# Patient Record
Sex: Male | Born: 1955 | Race: Black or African American | Hispanic: No | Smoking: Never smoker
Health system: Southern US, Community
[De-identification: ages and names within clinical notes are randomized; demographics above are authoritative.]

## PROBLEM LIST (undated history)

## (undated) DIAGNOSIS — R1310 Dysphagia, unspecified: Secondary | ICD-10-CM

## (undated) DIAGNOSIS — T7840XA Allergy, unspecified, initial encounter: Secondary | ICD-10-CM

## (undated) DIAGNOSIS — K5730 Diverticulosis of large intestine without perforation or abscess without bleeding: Secondary | ICD-10-CM

## (undated) DIAGNOSIS — M1990 Unspecified osteoarthritis, unspecified site: Secondary | ICD-10-CM

## (undated) DIAGNOSIS — I639 Cerebral infarction, unspecified: Secondary | ICD-10-CM

## (undated) DIAGNOSIS — E7800 Pure hypercholesterolemia, unspecified: Secondary | ICD-10-CM

## (undated) DIAGNOSIS — R0789 Other chest pain: Secondary | ICD-10-CM

## (undated) HISTORY — DX: Dysphagia, unspecified: R13.10

## (undated) HISTORY — DX: Pure hypercholesterolemia, unspecified: E78.00

## (undated) HISTORY — PX: UPPER GI ENDOSCOPY: SHX6162

## (undated) HISTORY — DX: Other chest pain: R07.89

## (undated) HISTORY — DX: Allergy, unspecified, initial encounter: T78.40XA

## (undated) HISTORY — DX: Diverticulosis of large intestine without perforation or abscess without bleeding: K57.30

## (undated) HISTORY — PX: COLONOSCOPY: SHX174

## (undated) HISTORY — DX: Unspecified osteoarthritis, unspecified site: M19.90

---

## 2004-05-21 ENCOUNTER — Ambulatory Visit: Payer: Self-pay | Admitting: Pulmonary Disease

## 2004-05-24 ENCOUNTER — Ambulatory Visit: Payer: Self-pay | Admitting: Pulmonary Disease

## 2004-05-24 ENCOUNTER — Ambulatory Visit (HOSPITAL_COMMUNITY): Admission: RE | Admit: 2004-05-24 | Discharge: 2004-05-24 | Payer: Self-pay | Admitting: Pulmonary Disease

## 2004-06-28 ENCOUNTER — Ambulatory Visit: Payer: Self-pay | Admitting: Pulmonary Disease

## 2005-04-17 ENCOUNTER — Encounter: Admission: RE | Admit: 2005-04-17 | Discharge: 2005-04-17 | Payer: Self-pay | Admitting: Occupational Medicine

## 2006-05-15 ENCOUNTER — Ambulatory Visit: Payer: Self-pay | Admitting: Pulmonary Disease

## 2006-05-26 ENCOUNTER — Ambulatory Visit: Payer: Self-pay | Admitting: Internal Medicine

## 2006-06-09 ENCOUNTER — Ambulatory Visit: Payer: Self-pay | Admitting: Internal Medicine

## 2008-03-27 ENCOUNTER — Ambulatory Visit: Payer: Self-pay | Admitting: Internal Medicine

## 2008-03-27 ENCOUNTER — Ambulatory Visit (HOSPITAL_COMMUNITY): Admission: RE | Admit: 2008-03-27 | Discharge: 2008-03-27 | Payer: Self-pay | Admitting: Internal Medicine

## 2008-03-27 ENCOUNTER — Ambulatory Visit: Payer: Self-pay | Admitting: Pulmonary Disease

## 2008-03-27 DIAGNOSIS — E7800 Pure hypercholesterolemia, unspecified: Secondary | ICD-10-CM

## 2008-03-27 DIAGNOSIS — R0789 Other chest pain: Secondary | ICD-10-CM

## 2008-03-27 DIAGNOSIS — R1310 Dysphagia, unspecified: Secondary | ICD-10-CM | POA: Insufficient documentation

## 2008-03-27 DIAGNOSIS — E782 Mixed hyperlipidemia: Secondary | ICD-10-CM | POA: Insufficient documentation

## 2008-03-31 ENCOUNTER — Telehealth (INDEPENDENT_AMBULATORY_CARE_PROVIDER_SITE_OTHER): Payer: Self-pay | Admitting: *Deleted

## 2008-04-14 ENCOUNTER — Ambulatory Visit: Payer: Self-pay | Admitting: Internal Medicine

## 2008-04-19 ENCOUNTER — Ambulatory Visit: Payer: Self-pay | Admitting: Internal Medicine

## 2009-03-09 ENCOUNTER — Ambulatory Visit: Payer: Self-pay | Admitting: Pulmonary Disease

## 2009-03-09 DIAGNOSIS — K5730 Diverticulosis of large intestine without perforation or abscess without bleeding: Secondary | ICD-10-CM

## 2009-04-13 ENCOUNTER — Telehealth: Payer: Self-pay | Admitting: Pulmonary Disease

## 2009-08-06 ENCOUNTER — Ambulatory Visit: Payer: Self-pay | Admitting: Internal Medicine

## 2009-08-07 LAB — CONVERTED CEMR LAB
CK-MB: 3.9 ng/mL (ref 0.3–4.0)
Relative Index: 1.3 (ref 0.0–2.5)
Total CK: 300 units/L — ABNORMAL HIGH (ref 7–232)

## 2009-08-08 ENCOUNTER — Telehealth: Payer: Self-pay | Admitting: Internal Medicine

## 2009-08-08 ENCOUNTER — Encounter: Payer: Self-pay | Admitting: Internal Medicine

## 2010-03-13 ENCOUNTER — Telehealth: Payer: Self-pay | Admitting: Pulmonary Disease

## 2010-04-19 ENCOUNTER — Ambulatory Visit: Payer: Self-pay | Admitting: Pulmonary Disease

## 2010-04-19 ENCOUNTER — Encounter: Payer: Self-pay | Admitting: Pulmonary Disease

## 2010-04-21 LAB — CONVERTED CEMR LAB
ALT: 22 units/L (ref 0–53)
AST: 19 units/L (ref 0–37)
Albumin: 4.2 g/dL (ref 3.5–5.2)
Alkaline Phosphatase: 77 units/L (ref 39–117)
BUN: 14 mg/dL (ref 6–23)
Basophils Absolute: 0 10*3/uL (ref 0.0–0.1)
Basophils Relative: 0.7 % (ref 0.0–3.0)
Bilirubin Urine: NEGATIVE
Bilirubin, Direct: 0.1 mg/dL (ref 0.0–0.3)
CO2: 30 meq/L (ref 19–32)
Calcium: 9.6 mg/dL (ref 8.4–10.5)
Chloride: 102 meq/L (ref 96–112)
Cholesterol: 224 mg/dL — ABNORMAL HIGH (ref 0–200)
Creatinine, Ser: 1.1 mg/dL (ref 0.4–1.5)
Direct LDL: 152.7 mg/dL
Eosinophils Absolute: 0.3 10*3/uL (ref 0.0–0.7)
Eosinophils Relative: 7 % — ABNORMAL HIGH (ref 0.0–5.0)
GFR calc non Af Amer: 90.6 mL/min (ref >60)
Glucose, Bld: 86 mg/dL (ref 70–99)
HCT: 44.5 % (ref 39.0–52.0)
HDL: 48.8 mg/dL (ref >39.00)
Hemoglobin, Urine: NEGATIVE
Hemoglobin: 15 g/dL (ref 13.0–17.0)
Ketones, ur: NEGATIVE mg/dL
Leukocytes, UA: NEGATIVE
Lymphocytes Relative: 31.7 % (ref 12.0–46.0)
Lymphs Abs: 1.5 10*3/uL (ref 0.7–4.0)
MCHC: 33.7 g/dL (ref 30.0–36.0)
MCV: 84.8 fL (ref 78.0–100.0)
Monocytes Absolute: 0.5 10*3/uL (ref 0.1–1.0)
Monocytes Relative: 9.7 % (ref 3.0–12.0)
Neutro Abs: 2.4 10*3/uL (ref 1.4–7.7)
Neutrophils Relative %: 50.9 % (ref 43.0–77.0)
Nitrite: NEGATIVE
PSA: 1.57 ng/mL (ref 0.10–4.00)
Platelets: 288 10*3/uL (ref 150.0–400.0)
Potassium: 4.7 meq/L (ref 3.5–5.1)
RBC: 5.25 M/uL (ref 4.22–5.81)
RDW: 14.2 % (ref 11.5–14.6)
Sodium: 138 meq/L (ref 135–145)
Specific Gravity, Urine: 1.025 (ref 1.000–1.030)
TSH: 0.97 microintl units/mL (ref 0.35–5.50)
Total Bilirubin: 0.9 mg/dL (ref 0.3–1.2)
Total CHOL/HDL Ratio: 5
Total Protein, Urine: NEGATIVE mg/dL
Total Protein: 7.6 g/dL (ref 6.0–8.3)
Triglycerides: 60 mg/dL (ref 0.0–149.0)
Urine Glucose: NEGATIVE mg/dL
Urobilinogen, UA: 1 (ref 0.0–1.0)
VLDL: 12 mg/dL (ref 0.0–40.0)
WBC: 4.7 10*3/uL (ref 4.5–10.5)
pH: 6 (ref 5.0–8.0)

## 2010-08-04 LAB — CONVERTED CEMR LAB
ALT: 23 units/L (ref 0–53)
AST: 19 units/L (ref 0–37)
Albumin: 4.3 g/dL (ref 3.5–5.2)
Alkaline Phosphatase: 69 units/L (ref 39–117)
BUN: 11 mg/dL (ref 6–23)
Basophils Absolute: 0 10*3/uL (ref 0.0–0.1)
Basophils Relative: 0.9 % (ref 0.0–3.0)
Bilirubin Urine: NEGATIVE
Bilirubin, Direct: 0.2 mg/dL (ref 0.0–0.3)
CO2: 31 meq/L (ref 19–32)
Calcium: 9.6 mg/dL (ref 8.4–10.5)
Chloride: 101 meq/L (ref 96–112)
Cholesterol: 207 mg/dL — ABNORMAL HIGH (ref 0–200)
Creatinine, Ser: 1 mg/dL (ref 0.4–1.5)
Direct LDL: 142.7 mg/dL
Eosinophils Absolute: 0.3 10*3/uL (ref 0.0–0.7)
Eosinophils Relative: 7.4 % — ABNORMAL HIGH (ref 0.0–5.0)
GFR calc non Af Amer: 100.49 mL/min (ref >60)
Glucose, Bld: 89 mg/dL (ref 70–99)
HCT: 45.6 % (ref 39.0–52.0)
HDL: 43.5 mg/dL (ref >39.00)
Hemoglobin, Urine: NEGATIVE
Hemoglobin: 14.8 g/dL (ref 13.0–17.0)
Ketones, ur: NEGATIVE mg/dL
Leukocytes, UA: NEGATIVE
Lymphocytes Relative: 35.5 % (ref 12.0–46.0)
Lymphs Abs: 1.6 10*3/uL (ref 0.7–4.0)
MCHC: 32.5 g/dL (ref 30.0–36.0)
MCV: 86.3 fL (ref 78.0–100.0)
Monocytes Absolute: 0.3 10*3/uL (ref 0.1–1.0)
Monocytes Relative: 6.7 % (ref 3.0–12.0)
Neutro Abs: 2.2 10*3/uL (ref 1.4–7.7)
Neutrophils Relative %: 49.5 % (ref 43.0–77.0)
Nitrite: NEGATIVE
PSA: 1.14 ng/mL (ref 0.10–4.00)
Platelets: 278 10*3/uL (ref 150.0–400.0)
Potassium: 4.1 meq/L (ref 3.5–5.1)
RBC: 5.29 M/uL (ref 4.22–5.81)
RDW: 13.2 % (ref 11.5–14.6)
Sodium: 138 meq/L (ref 135–145)
Specific Gravity, Urine: 1.02 (ref 1.000–1.030)
TSH: 1.05 microintl units/mL (ref 0.35–5.50)
Total Bilirubin: 1 mg/dL (ref 0.3–1.2)
Total CHOL/HDL Ratio: 5
Total Protein, Urine: NEGATIVE mg/dL
Total Protein: 8.1 g/dL (ref 6.0–8.3)
Triglycerides: 75 mg/dL (ref 0.0–149.0)
Urine Glucose: NEGATIVE mg/dL
Urobilinogen, UA: 0.2 (ref 0.0–1.0)
VLDL: 15 mg/dL (ref 0.0–40.0)
WBC: 4.4 10*3/uL — ABNORMAL LOW (ref 4.5–10.5)
pH: 7 (ref 5.0–8.0)
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
## 2010-08-08 NOTE — Progress Notes (Signed)
Summary: nos appt  Phone Note Call from Patient   Caller: [email or web address]  Call For: nadel Summary of Call: Rsc nos from 9/6 to 10/14 @ 9a. Initial call taken by: Darletta Moll,  March 13, 2010 9:19 AM
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
## 2010-08-08 NOTE — Progress Notes (Signed)
Summary: note  Phone Note Call from Patient Call back at Home Phone [phone]   Caller: Patient Call For: nadel Reason for Call: Talk to Nurse Summary of Call: was in Monday.  Needs a note to go back to work.  Will pick up at lunch Initial call taken by: Eugene Gavia,  August 08, 2009 9:32 AM  Follow-up for Phone Call        [facility] to see exactly what dates the pt needs on the note before I forward to MD. Carron Curie CMA  August 08, 2009 10:01 AM  Pt was seen On 08/06/09. He is requesting a work note for 08/06/09 and 08/07/09 he wants to return to work today. Pelase advise if okay to create latter. Thanks. Carron Curie CMA  August 08, 2009 10:09 AM ok  Follow-up by: Nyoka Cowden MD,  August 08, 2009 11:12 AM  Additional Follow-up for Phone Call Additional follow up Details #1::        note printed. pt awae ready to pick up. Carron Curie CMA  August 08, 2009 11:17 AM

## 2010-08-08 NOTE — Letter (Signed)
Summary: Out of Work  [employer]  [address]   [place], [place] [address]   Phone: [phone]  Fax: [phone]    August 08, 2009   Employee:  FITZHUGH VIZCARRONDO Channell    To Whom It May Concern:   For Medical reasons, please excuse the above named employee from work for the following dates:  Start:   08/06/09  End:   pt may return to work on 08/08/09  If you need additional information, please feel free to contact our office.         Sincerely,    Dr. Sandrea Hughs

## 2011-02-06 ENCOUNTER — Telehealth: Payer: Self-pay | Admitting: Pulmonary Disease

## 2011-02-06 NOTE — Telephone Encounter (Signed)
Last seen by SN 2.2.12 for cpx.  Called spoke with patient who found a tick in his groin this morning in his pubic hair.  Reports tick was not attached but did have some blood in when he "smashed it."  Pt going out of town tomorrow to [place] for "a couple of days for a funeral."  Advised patient to keep an eye out for any redness, bumps, itching, fever, changes in breathing or vision and to call if he notices any of these symptoms.  Advised pt that if the tick was not attached at the groin area, he may not have been bitten there and encouraged him to watch for these signs anywhere.  Pt okay with this and verbalized his understanding.

## 2011-04-07 LAB — CLOTEST (H. PYLORI), BIOPSY: Helicobacter screen: NEGATIVE

## 2011-04-17 ENCOUNTER — Encounter: Payer: Self-pay | Admitting: Pulmonary Disease

## 2011-04-21 ENCOUNTER — Ambulatory Visit (INDEPENDENT_AMBULATORY_CARE_PROVIDER_SITE_OTHER): Payer: BC Managed Care – PPO | Admitting: Pulmonary Disease

## 2011-04-21 ENCOUNTER — Ambulatory Visit (INDEPENDENT_AMBULATORY_CARE_PROVIDER_SITE_OTHER)
Admission: RE | Admit: 2011-04-21 | Discharge: 2011-04-21 | Disposition: A | Discharge status: Home / Self Care | Payer: BC Managed Care – PPO | Source: Ambulatory Visit | Attending: Pulmonary Disease | Admitting: Pulmonary Disease

## 2011-04-21 ENCOUNTER — Encounter: Payer: Self-pay | Admitting: Pulmonary Disease

## 2011-04-21 VITALS — BP 138/86 | HR 77 | Temp 97.9°F | Ht 74.0 in | Wt 221.2 lb

## 2011-04-21 DIAGNOSIS — E7800 Pure hypercholesterolemia, unspecified: Secondary | ICD-10-CM

## 2011-04-21 DIAGNOSIS — Z0000 Encounter for general adult medical examination without abnormal findings: Secondary | ICD-10-CM

## 2011-04-21 DIAGNOSIS — K5730 Diverticulosis of large intestine without perforation or abscess without bleeding: Secondary | ICD-10-CM

## 2011-04-21 DIAGNOSIS — R1310 Dysphagia, unspecified: Secondary | ICD-10-CM

## 2011-04-21 DIAGNOSIS — R0789 Other chest pain: Secondary | ICD-10-CM
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
## 2011-04-21 NOTE — Patient Instructions (Signed)
Today we updated your meds in our EPIC system...  Today we did your follow up CXR & EKG... Please return to our lab one morning this week for your FASTING blood work...    Then please call the [facility] in a few days for your results...    Dial [id number] & when prompted enter your patient number followed by the # symbol...    Your patient number is:   [id number]#  Let's get on track w/ our diet 7 exercise program...    The goal is to lose 15-20 lbs...  Call for any problems...  Let's plan a routine follow up in one year's time.Marland KitchenMarland Kitchen

## 2011-04-22 ENCOUNTER — Other Ambulatory Visit: Payer: Self-pay | Admitting: Pulmonary Disease

## 2011-04-22 ENCOUNTER — Other Ambulatory Visit (INDEPENDENT_AMBULATORY_CARE_PROVIDER_SITE_OTHER): Payer: BC Managed Care – PPO

## 2011-04-22 DIAGNOSIS — Z0000 Encounter for general adult medical examination without abnormal findings: Secondary | ICD-10-CM

## 2011-04-22 LAB — PSA: PSA: 1.58 ng/mL (ref 0.10–4.00)

## 2011-04-22 LAB — URINALYSIS
Bilirubin Urine: NEGATIVE
Hgb urine dipstick: NEGATIVE
Ketones, ur: NEGATIVE
Leukocytes, UA: NEGATIVE
Nitrite: NEGATIVE
Specific Gravity, Urine: >=1.03 (ref 1.000–1.030)
Total Protein, Urine: NEGATIVE
Urine Glucose: NEGATIVE
Urobilinogen, UA: 0.2 (ref 0.0–1.0)
pH: 6 (ref 5.0–8.0)

## 2011-04-22 LAB — HEPATIC FUNCTION PANEL
ALT: 19 U/L (ref 0–53)
AST: 17 U/L (ref 0–37)
Albumin: 4.1 g/dL (ref 3.5–5.2)
Alkaline Phosphatase: 66 U/L (ref 39–117)
Bilirubin, Direct: 0.1 mg/dL (ref 0.0–0.3)
Total Protein: 7.6 g/dL (ref 6.0–8.3)

## 2011-04-22 LAB — CBC WITH DIFFERENTIAL/PLATELET
Basophils Absolute: 0 10*3/uL (ref 0.0–0.1)
Eosinophils Absolute: 0.2 10*3/uL (ref 0.0–0.7)
HCT: 43.3 % (ref 39.0–52.0)
Hemoglobin: 14.1 g/dL (ref 13.0–17.0)
Lymphocytes Relative: 36.3 % (ref 12.0–46.0)
MCHC: 32.7 g/dL (ref 30.0–36.0)
MCV: 85.3 fl (ref 78.0–100.0)
Monocytes Relative: 6.4 % (ref 3.0–12.0)
Neutro Abs: 2.5 10*3/uL (ref 1.4–7.7)
Platelets: 271 10*3/uL (ref 150.0–400.0)
RBC: 5.07 Mil/uL (ref 4.22–5.81)
RDW: 14.1 % (ref 11.5–14.6)
WBC: 4.8 10*3/uL (ref 4.5–10.5)

## 2011-04-22 LAB — BASIC METABOLIC PANEL
CO2: 29 mEq/L (ref 19–32)
Calcium: 9.4 mg/dL (ref 8.4–10.5)
Chloride: 104 mEq/L (ref 96–112)
GFR: 100.86 mL/min (ref >60.00)
Glucose, Bld: 88 mg/dL (ref 70–99)
Potassium: 4.3 mEq/L (ref 3.5–5.1)
Sodium: 139 mEq/L (ref 135–145)

## 2011-04-22 LAB — LIPID PANEL
HDL: 46.9 mg/dL (ref >39.00)
Total CHOL/HDL Ratio: 5

## 2011-04-22 LAB — TSH: TSH: 1.59 u[IU]/mL (ref 0.35–5.50)

## 2011-05-05 ENCOUNTER — Encounter: Payer: Self-pay | Admitting: Pulmonary Disease

## 2011-05-08 ENCOUNTER — Telehealth: Payer: Self-pay | Admitting: Pulmonary Disease

## 2011-05-08 NOTE — Telephone Encounter (Signed)
SN, pt decided to try statin- please advise what to call in for him, thanks! No Known Allergies

## 2011-05-09 MED ORDER — ROSUVASTATIN CALCIUM 20 MG PO TABS: 20.0000 mg | ORAL_TABLET | Freq: Every day | ORAL | Status: DC

## 2011-05-09 NOTE — Telephone Encounter (Signed)
Called and spoke with pt and he stated that he is willing to try something for his chol.  Per SN--ok to start on crestor 20mg    Once daily.  Pt is aware that this has been sent to the pharmacy per his request for a #90 day supply and pt is aware to call in about 8-12 weeks to have his labs rechecked on the crestor.  Pt voiced his understanding of this.

## 2011-05-22 ENCOUNTER — Ambulatory Visit (INDEPENDENT_AMBULATORY_CARE_PROVIDER_SITE_OTHER): Payer: BC Managed Care – PPO | Admitting: Adult Health

## 2011-05-22 ENCOUNTER — Encounter: Payer: Self-pay | Admitting: Adult Health

## 2011-05-22 ENCOUNTER — Encounter: Payer: Self-pay | Admitting: Allergy

## 2011-05-22 DIAGNOSIS — J069 Acute upper respiratory infection, unspecified: Secondary | ICD-10-CM

## 2011-05-22 NOTE — Patient Instructions (Signed)
Mucinex DM Twice daily  As needed  Cough/congestion  Fluids and rest  Tylenol As needed   Saline nasal rinses As needed   Please contact office for sooner follow up if symptoms do not improve or worsen or seek emergency care

## 2011-05-22 NOTE — Assessment & Plan Note (Signed)
Flare -suspect this is viral in nature and self liimiting  Plan:  Mucinex DM Twice daily  As needed  Cough/congestion  Fluids and rest  Tylenol As needed   Saline nasal rinses As needed   Please contact office for sooner follow up if symptoms do not improve or worsen or seek emergency care
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
## 2012-04-19 ENCOUNTER — Encounter: Payer: Self-pay | Admitting: *Deleted

## 2012-04-20 ENCOUNTER — Ambulatory Visit (INDEPENDENT_AMBULATORY_CARE_PROVIDER_SITE_OTHER)
Admission: RE | Admit: 2012-04-20 | Discharge: 2012-04-20 | Disposition: A | Discharge status: Home / Self Care | Payer: BC Managed Care – PPO | Source: Ambulatory Visit | Attending: Pulmonary Disease | Admitting: Pulmonary Disease

## 2012-04-20 ENCOUNTER — Other Ambulatory Visit (INDEPENDENT_AMBULATORY_CARE_PROVIDER_SITE_OTHER): Payer: BC Managed Care – PPO

## 2012-04-20 ENCOUNTER — Ambulatory Visit (INDEPENDENT_AMBULATORY_CARE_PROVIDER_SITE_OTHER): Payer: BC Managed Care – PPO | Admitting: Pulmonary Disease

## 2012-04-20 ENCOUNTER — Encounter: Payer: Self-pay | Admitting: Pulmonary Disease

## 2012-04-20 VITALS — BP 122/80 | HR 69 | Temp 98.2°F | Ht 74.0 in | Wt 217.2 lb

## 2012-04-20 DIAGNOSIS — Z0000 Encounter for general adult medical examination without abnormal findings: Secondary | ICD-10-CM

## 2012-04-20 DIAGNOSIS — E7800 Pure hypercholesterolemia, unspecified: Secondary | ICD-10-CM

## 2012-04-20 DIAGNOSIS — K5730 Diverticulosis of large intestine without perforation or abscess without bleeding: Secondary | ICD-10-CM

## 2012-04-20 LAB — CBC WITH DIFFERENTIAL/PLATELET
Basophils Relative: 0.5 % (ref 0.0–3.0)
Eosinophils Relative: 5.2 % — ABNORMAL HIGH (ref 0.0–5.0)
HCT: 43.7 % (ref 39.0–52.0)
Lymphs Abs: 1.7 10*3/uL (ref 0.7–4.0)
MCV: 84.2 fl (ref 78.0–100.0)
Monocytes Absolute: 0.4 10*3/uL (ref 0.1–1.0)
Neutro Abs: 2.6 10*3/uL (ref 1.4–7.7)
RBC: 5.19 Mil/uL (ref 4.22–5.81)
WBC: 5 10*3/uL (ref 4.5–10.5)

## 2012-04-20 LAB — LIPID PANEL
HDL: 46.8 mg/dL (ref >39.00)
VLDL: 17 mg/dL (ref 0.0–40.0)

## 2012-04-20 LAB — BASIC METABOLIC PANEL
BUN: 10 mg/dL (ref 6–23)
Creatinine, Ser: 1.1 mg/dL (ref 0.4–1.5)
GFR: 89.93 mL/min (ref >60.00)
Potassium: 4.5 mEq/L (ref 3.5–5.1)

## 2012-04-20 LAB — HEPATIC FUNCTION PANEL
AST: 18 U/L (ref 0–37)
Total Bilirubin: 0.6 mg/dL (ref 0.3–1.2)

## 2012-04-20 NOTE — Patient Instructions (Addendum)
Today we updated your med list in our EPIC system...    Continue your current medications the same...  Today we did your follow up CXR & FASTING blood work...    We will call you w/ the results and see if you need to start on a cholestrol med...  Keep up the good work w/ diet & exercise... Work on weight reduction...  Call for any problems...  Let's continue our yearly follow up physicals, but if we start cholesterol meds we will want to bring you back to our lab to monitor your progress.Marland KitchenMarland Kitchen
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
## 2012-04-26 ENCOUNTER — Other Ambulatory Visit: Payer: Self-pay | Admitting: Pulmonary Disease

## 2012-04-26 DIAGNOSIS — R972 Elevated prostate specific antigen [PSA]: Secondary | ICD-10-CM

## 2012-04-26 DIAGNOSIS — E7800 Pure hypercholesterolemia, unspecified: Secondary | ICD-10-CM

## 2012-04-26 MED ORDER — ROSUVASTATIN CALCIUM 20 MG PO TABS: 20.0000 mg | ORAL_TABLET | Freq: Every day | ORAL | Status: DC

## 2012-10-07 IMAGING — CR DG CHEST 2V
2 series · 2 of 2 positions shown · non-contrast
Comparison: 04/19/2010

CLINICAL DATA: Physical.

CHEST - 2 VIEW

[view not recorded (1 of 2)]
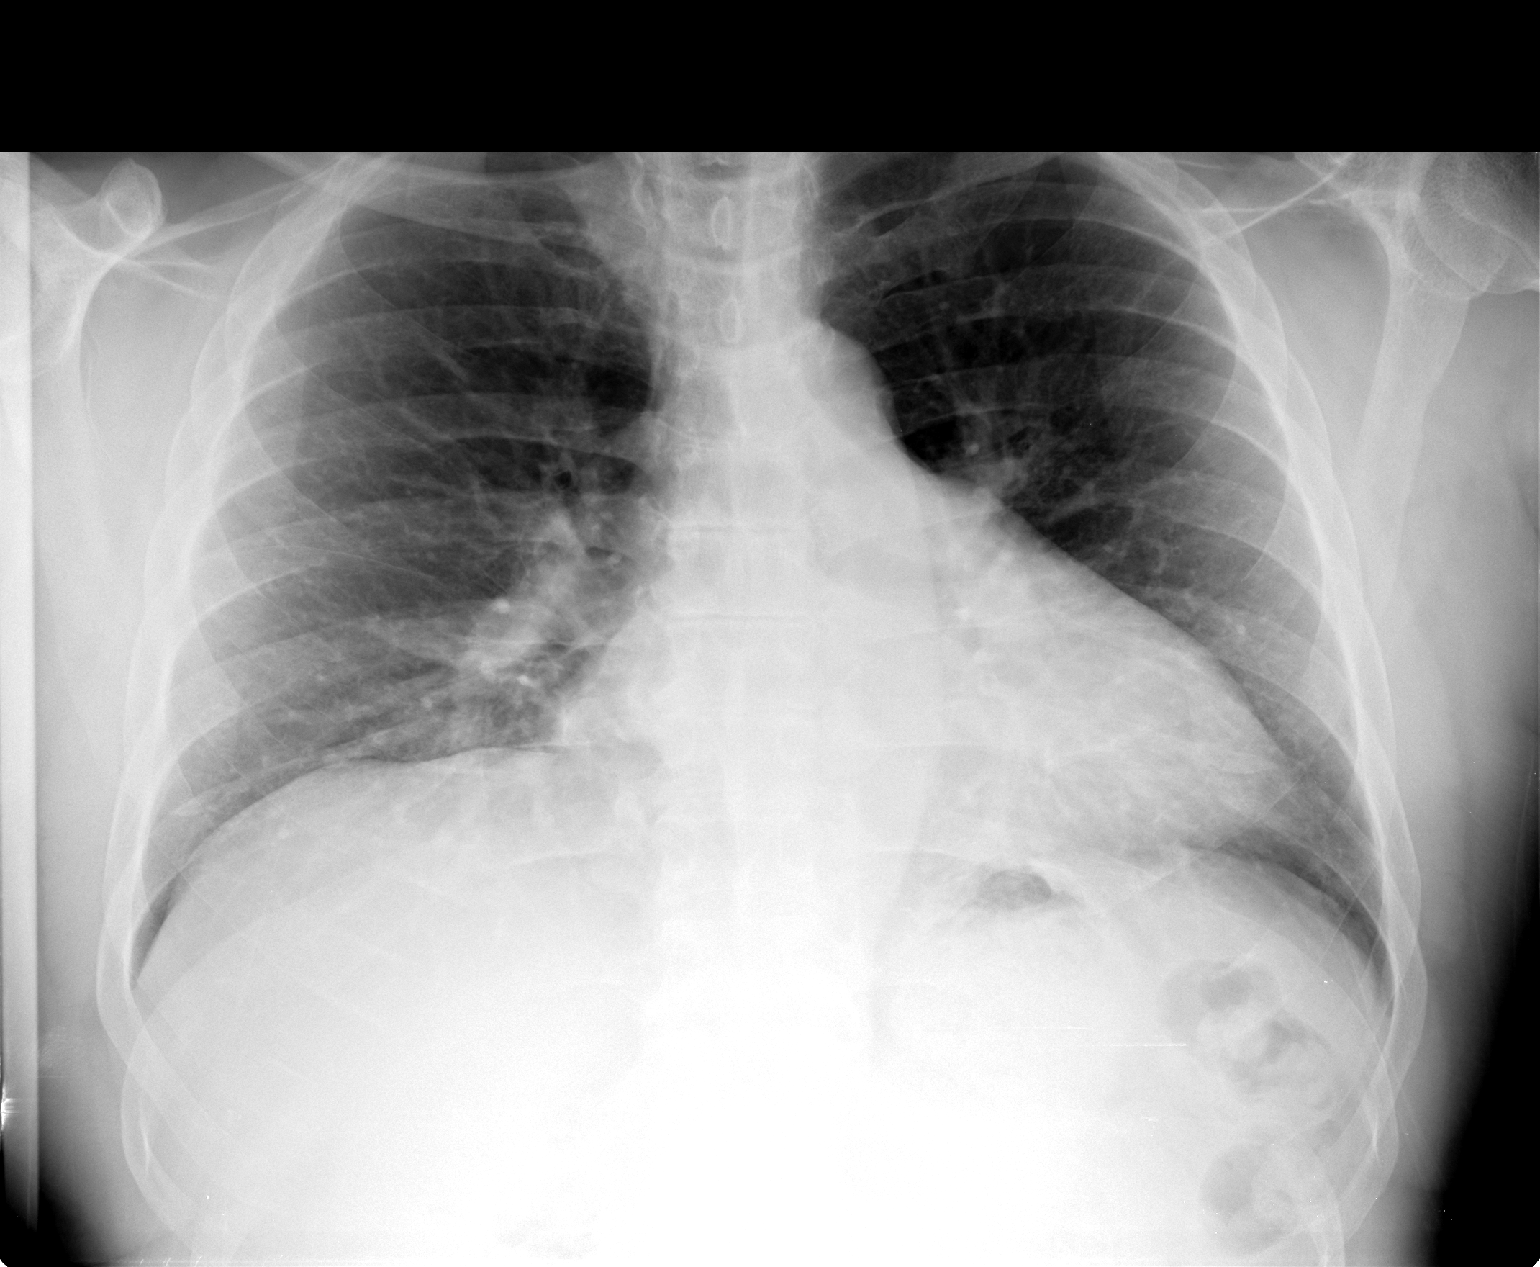

[view not recorded (2 of 2)]
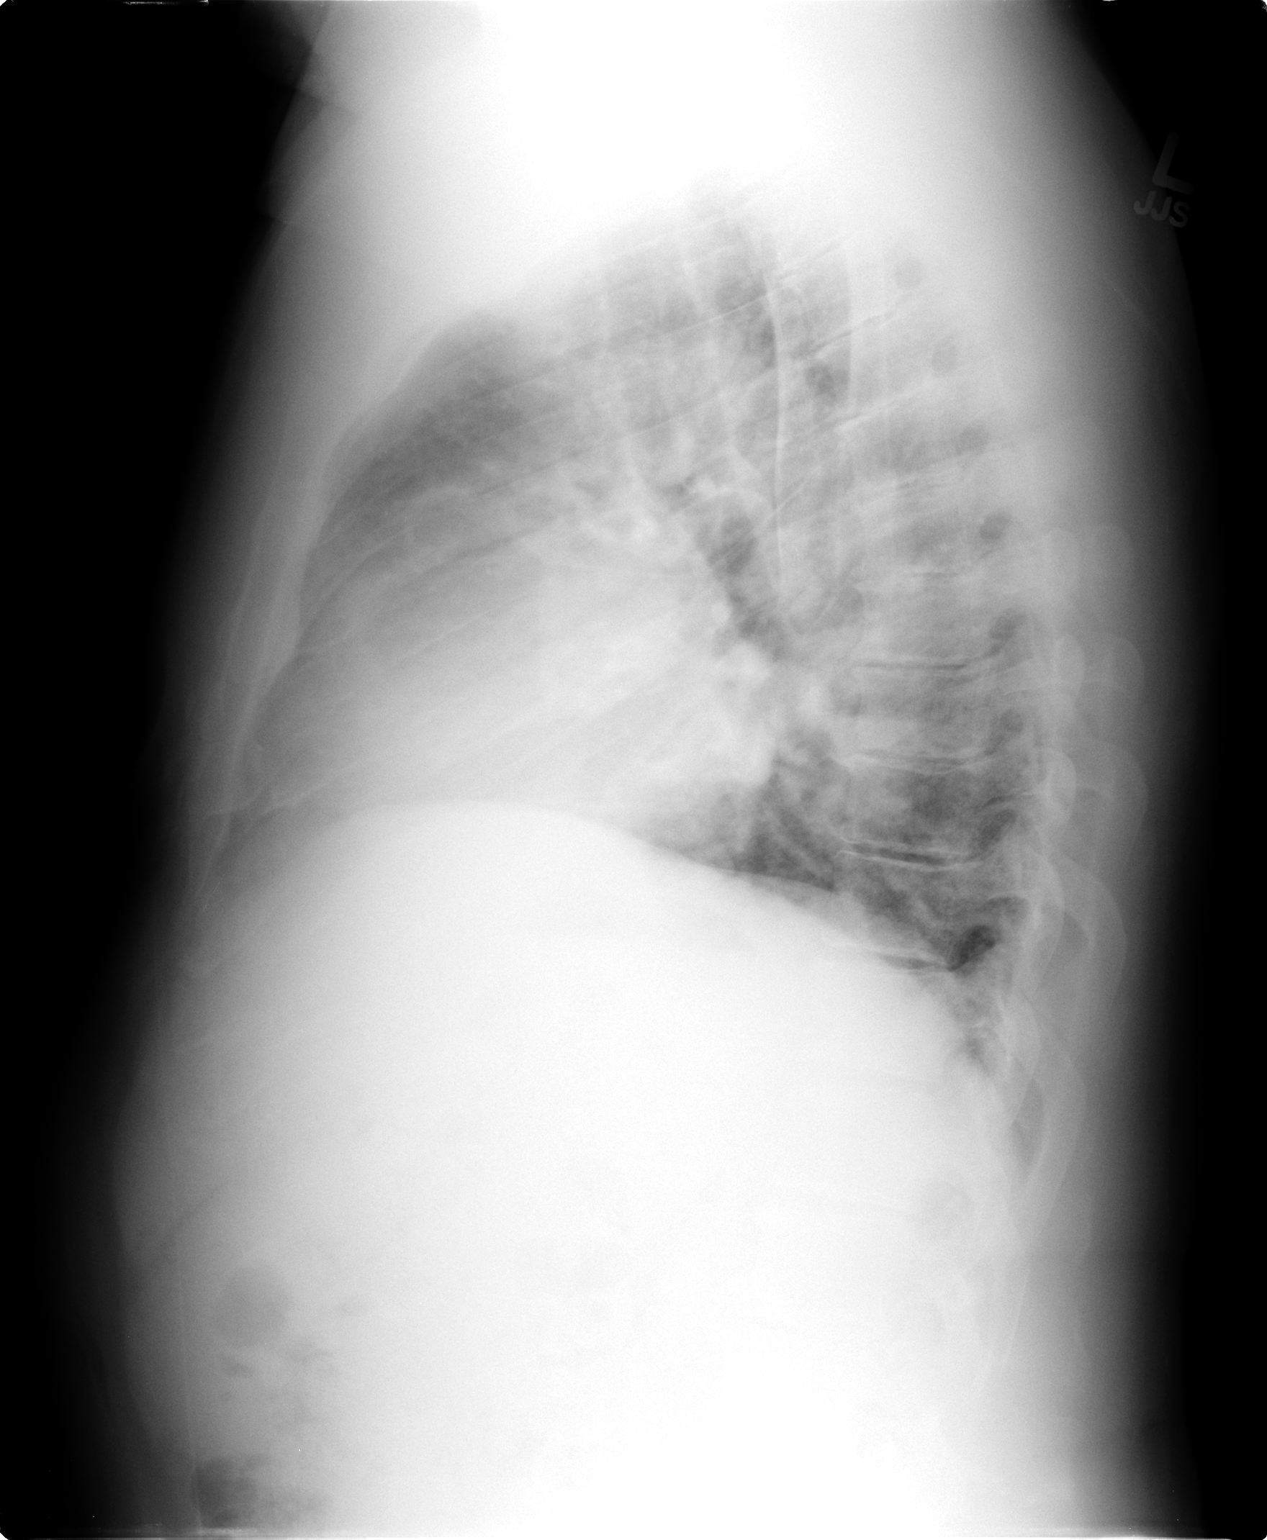

[2 of 2 positions shown; findings below may reference images not displayed]

FINDINGS: Mild cardiomegaly.  There are low lung volumes with
bibasilar atelectasis.  No effusions or overt edema.  No acute bony
abnormality.
IMPRESSION: Cardiomegaly.  Bibasilar atelectasis.

## 2013-04-20 ENCOUNTER — Ambulatory Visit (INDEPENDENT_AMBULATORY_CARE_PROVIDER_SITE_OTHER): Payer: BC Managed Care – PPO | Admitting: Pulmonary Disease

## 2013-04-20 ENCOUNTER — Encounter: Payer: Self-pay | Admitting: Pulmonary Disease

## 2013-04-20 ENCOUNTER — Other Ambulatory Visit (INDEPENDENT_AMBULATORY_CARE_PROVIDER_SITE_OTHER): Payer: BC Managed Care – PPO

## 2013-04-20 VITALS — BP 120/84 | HR 69 | Temp 97.8°F | Ht 74.0 in | Wt 212.4 lb

## 2013-04-20 DIAGNOSIS — Z0000 Encounter for general adult medical examination without abnormal findings: Secondary | ICD-10-CM | POA: Insufficient documentation

## 2013-04-20 DIAGNOSIS — K5730 Diverticulosis of large intestine without perforation or abscess without bleeding: Secondary | ICD-10-CM

## 2013-04-20 DIAGNOSIS — E7800 Pure hypercholesterolemia, unspecified: Secondary | ICD-10-CM

## 2013-04-20 LAB — BASIC METABOLIC PANEL
Calcium: 9.8 mg/dL (ref 8.4–10.5)
GFR: 90.57 mL/min (ref >60.00)
Sodium: 137 mEq/L (ref 135–145)

## 2013-04-20 LAB — CBC WITH DIFFERENTIAL/PLATELET
Basophils Absolute: 0 10*3/uL (ref 0.0–0.1)
Basophils Relative: 0.5 % (ref 0.0–3.0)
HCT: 41.4 % (ref 39.0–52.0)
Hemoglobin: 13.8 g/dL (ref 13.0–17.0)
Lymphs Abs: 1.8 10*3/uL (ref 0.7–4.0)
MCHC: 33.4 g/dL (ref 30.0–36.0)
Monocytes Relative: 7.4 % (ref 3.0–12.0)
Neutro Abs: 2.5 10*3/uL (ref 1.4–7.7)
RBC: 5.05 Mil/uL (ref 4.22–5.81)
RDW: 14 % (ref 11.5–14.6)

## 2013-04-20 LAB — LIPID PANEL
HDL: 47.9 mg/dL (ref >39.00)
Total CHOL/HDL Ratio: 5
Triglycerides: 73 mg/dL (ref 0.0–149.0)

## 2013-04-20 LAB — HEPATIC FUNCTION PANEL
Albumin: 4.2 g/dL (ref 3.5–5.2)
Alkaline Phosphatase: 74 U/L (ref 39–117)
Bilirubin, Direct: 0.1 mg/dL (ref 0.0–0.3)

## 2013-04-20 MED ORDER — ROSUVASTATIN CALCIUM 20 MG PO TABS: 20.0000 mg | ORAL_TABLET | Freq: Every day | ORAL | Status: DC
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
## 2013-04-20 NOTE — Patient Instructions (Signed)
Today we updated your med list in our EPIC system...    Continue your current medications the same...  Today we wrote a new prescription for CRESTOR 20mg  to take one tab daily for your Choloesterol...    Be sure to follow a low cholesterol/ low fat diet as well...  We should plan a follow up LIPID PROFILE in about 3 months- call us in January when you are ready to recheck this lab...  Today we did your follow up CXR, EKG, & FASTING blood work...    We will contact you w/ the results when available...   Call for any questions...  Let's plan a follow up visit in 65yr, sooner if needed for problems.Marland KitchenMarland Kitchen

## 2013-04-25 ENCOUNTER — Other Ambulatory Visit: Payer: Self-pay | Admitting: Pulmonary Disease

## 2013-04-25 DIAGNOSIS — E7800 Pure hypercholesterolemia, unspecified: Secondary | ICD-10-CM

## 2013-05-17 ENCOUNTER — Other Ambulatory Visit: Payer: Self-pay | Admitting: Pulmonary Disease

## 2013-05-17 MED ORDER — ROSUVASTATIN CALCIUM 20 MG PO TABS: 20.0000 mg | ORAL_TABLET | Freq: Every day | ORAL | Status: DC

## 2014-03-21 ENCOUNTER — Ambulatory Visit: Payer: BC Managed Care – PPO | Admitting: Pulmonary Disease

## 2014-04-20 ENCOUNTER — Encounter: Payer: Self-pay | Admitting: Pulmonary Disease

## 2014-04-20 ENCOUNTER — Encounter (INDEPENDENT_AMBULATORY_CARE_PROVIDER_SITE_OTHER): Payer: Self-pay

## 2014-04-20 ENCOUNTER — Ambulatory Visit (INDEPENDENT_AMBULATORY_CARE_PROVIDER_SITE_OTHER): Payer: BC Managed Care – PPO | Admitting: Pulmonary Disease

## 2014-04-20 VITALS — BP 120/84 | HR 65 | Temp 98.3°F | Ht 74.0 in | Wt 220.0 lb

## 2014-04-20 DIAGNOSIS — E7800 Pure hypercholesterolemia, unspecified: Secondary | ICD-10-CM

## 2014-04-20 DIAGNOSIS — R1310 Dysphagia, unspecified: Secondary | ICD-10-CM

## 2014-04-20 DIAGNOSIS — Z0000 Encounter for general adult medical examination without abnormal findings: Secondary | ICD-10-CM

## 2014-04-20 DIAGNOSIS — K5730 Diverticulosis of large intestine without perforation or abscess without bleeding: Secondary | ICD-10-CM

## 2014-04-20 MED ORDER — ROSUVASTATIN CALCIUM 20 MG PO TABS: 20.0000 mg | ORAL_TABLET | Freq: Every day | ORAL | Status: DC

## 2014-04-20 NOTE — Patient Instructions (Signed)
Today we updated your med list in our EPIC system...    Continue your current medications the same...  Please return to our lab in the AM for your FASTING blood work and f/u CXR...    We will contact you w/ the results when available...   We refilled your Crestor20... Be sure to take it daily...     Then let's plan a follow up Lipid Panel in about 3 months...  Keep up the good work w/ diet & exercise...  Call for any questions...  Let's plan a follow up visit in 89yr, sooner if needed for problems.Marland Kitchen..Marland Kitchen
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
## 2014-04-21 ENCOUNTER — Other Ambulatory Visit (INDEPENDENT_AMBULATORY_CARE_PROVIDER_SITE_OTHER): Payer: BC Managed Care – PPO

## 2014-04-21 ENCOUNTER — Ambulatory Visit (INDEPENDENT_AMBULATORY_CARE_PROVIDER_SITE_OTHER)
Admission: RE | Admit: 2014-04-21 | Discharge: 2014-04-21 | Disposition: A | Discharge status: Home / Self Care | Payer: BC Managed Care – PPO | Source: Ambulatory Visit | Attending: Pulmonary Disease | Admitting: Pulmonary Disease

## 2014-04-21 DIAGNOSIS — Z0000 Encounter for general adult medical examination without abnormal findings: Secondary | ICD-10-CM

## 2014-04-21 DIAGNOSIS — E7800 Pure hypercholesterolemia, unspecified: Secondary | ICD-10-CM

## 2014-04-21 LAB — CBC WITH DIFFERENTIAL/PLATELET
BASOS PCT: 0.5 % (ref 0.0–3.0)
Basophils Absolute: 0 10*3/uL (ref 0.0–0.1)
EOS ABS: 0.2 10*3/uL (ref 0.0–0.7)
Eosinophils Relative: 4.8 % (ref 0.0–5.0)
HCT: 42.1 % (ref 39.0–52.0)
Hemoglobin: 13.7 g/dL (ref 13.0–17.0)
Lymphocytes Relative: 39.8 % (ref 12.0–46.0)
Lymphs Abs: 2 10*3/uL (ref 0.7–4.0)
MCHC: 32.5 g/dL (ref 30.0–36.0)
MCV: 83.4 fl (ref 78.0–100.0)
MONO ABS: 0.3 10*3/uL (ref 0.1–1.0)
Monocytes Relative: 6.6 % (ref 3.0–12.0)
NEUTROS PCT: 48.3 % (ref 43.0–77.0)
Neutro Abs: 2.4 10*3/uL (ref 1.4–7.7)
PLATELETS: 328 10*3/uL (ref 150.0–400.0)
RBC: 5.05 Mil/uL (ref 4.22–5.81)
RDW: 14.3 % (ref 11.5–15.5)
WBC: 5 10*3/uL (ref 4.0–10.5)

## 2014-04-21 LAB — HEPATIC FUNCTION PANEL
ALK PHOS: 69 U/L (ref 39–117)
ALT: 26 U/L (ref 0–53)
AST: 21 U/L (ref 0–37)
Albumin: 3.5 g/dL (ref 3.5–5.2)
BILIRUBIN DIRECT: 0.1 mg/dL (ref 0.0–0.3)
Total Bilirubin: 0.6 mg/dL (ref 0.2–1.2)
Total Protein: 7.5 g/dL (ref 6.0–8.3)

## 2014-04-21 LAB — TSH: TSH: 1.64 u[IU]/mL (ref 0.35–4.50)

## 2014-04-21 LAB — BASIC METABOLIC PANEL
BUN: 14 mg/dL (ref 6–23)
CHLORIDE: 103 meq/L (ref 96–112)
CO2: 27 meq/L (ref 19–32)
CREATININE: 1 mg/dL (ref 0.4–1.5)
Calcium: 9.7 mg/dL (ref 8.4–10.5)
GFR: 94.27 mL/min (ref >60.00)
GLUCOSE: 93 mg/dL (ref 70–99)
Potassium: 4.4 mEq/L (ref 3.5–5.1)
Sodium: 139 mEq/L (ref 135–145)

## 2014-04-21 LAB — LIPID PANEL
CHOL/HDL RATIO: 5
Cholesterol: 211 mg/dL — ABNORMAL HIGH (ref 0–200)
HDL: 40.7 mg/dL (ref >39.00)
LDL Cholesterol: 157 mg/dL — ABNORMAL HIGH (ref 0–99)
NONHDL: 170.3
TRIGLYCERIDES: 65 mg/dL (ref 0.0–149.0)
VLDL: 13 mg/dL (ref 0.0–40.0)

## 2014-04-21 LAB — PSA: PSA: 2.52 ng/mL (ref 0.10–4.00)

## 2015-04-23 ENCOUNTER — Encounter: Payer: Self-pay | Admitting: Pulmonary Disease

## 2015-04-23 ENCOUNTER — Other Ambulatory Visit (INDEPENDENT_AMBULATORY_CARE_PROVIDER_SITE_OTHER): Payer: BLUE CROSS/BLUE SHIELD

## 2015-04-23 ENCOUNTER — Ambulatory Visit (INDEPENDENT_AMBULATORY_CARE_PROVIDER_SITE_OTHER)
Admission: RE | Admit: 2015-04-23 | Discharge: 2015-04-23 | Disposition: A | Discharge status: Home / Self Care | Payer: BLUE CROSS/BLUE SHIELD | Source: Ambulatory Visit | Attending: Pulmonary Disease | Admitting: Pulmonary Disease

## 2015-04-23 ENCOUNTER — Ambulatory Visit (INDEPENDENT_AMBULATORY_CARE_PROVIDER_SITE_OTHER): Payer: BLUE CROSS/BLUE SHIELD | Admitting: Pulmonary Disease

## 2015-04-23 VITALS — BP 106/70 | HR 90 | Temp 97.9°F | Wt 210.0 lb

## 2015-04-23 DIAGNOSIS — E7800 Pure hypercholesterolemia, unspecified: Secondary | ICD-10-CM

## 2015-04-23 DIAGNOSIS — F419 Anxiety disorder, unspecified: Secondary | ICD-10-CM | POA: Diagnosis not present

## 2015-04-23 DIAGNOSIS — Z0000 Encounter for general adult medical examination without abnormal findings: Secondary | ICD-10-CM

## 2015-04-23 DIAGNOSIS — N400 Enlarged prostate without lower urinary tract symptoms: Secondary | ICD-10-CM

## 2015-04-23 DIAGNOSIS — R1310 Dysphagia, unspecified: Secondary | ICD-10-CM | POA: Diagnosis not present

## 2015-04-23 DIAGNOSIS — K5730 Diverticulosis of large intestine without perforation or abscess without bleeding: Secondary | ICD-10-CM

## 2015-04-23 LAB — HEPATIC FUNCTION PANEL
ALT: 20 U/L (ref 0–53)
AST: 15 U/L (ref 0–37)
Albumin: 4.4 g/dL (ref 3.5–5.2)
Alkaline Phosphatase: 77 U/L (ref 39–117)
BILIRUBIN DIRECT: 0.1 mg/dL (ref 0.0–0.3)
BILIRUBIN TOTAL: 0.6 mg/dL (ref 0.2–1.2)
Total Protein: 7.7 g/dL (ref 6.0–8.3)

## 2015-04-23 LAB — LIPID PANEL
CHOL/HDL RATIO: 4
Cholesterol: 222 mg/dL — ABNORMAL HIGH (ref 0–200)
HDL: 51.4 mg/dL (ref >39.00)
LDL CALC: 156 mg/dL — AB (ref 0–99)
NONHDL: 170.43
TRIGLYCERIDES: 72 mg/dL (ref 0.0–149.0)
VLDL: 14.4 mg/dL (ref 0.0–40.0)

## 2015-04-23 LAB — CBC WITH DIFFERENTIAL/PLATELET
BASOS PCT: 0.6 % (ref 0.0–3.0)
Basophils Absolute: 0 10*3/uL (ref 0.0–0.1)
Eosinophils Absolute: 0.2 10*3/uL (ref 0.0–0.7)
Eosinophils Relative: 3.6 % (ref 0.0–5.0)
HEMATOCRIT: 45.1 % (ref 39.0–52.0)
Hemoglobin: 14.6 g/dL (ref 13.0–17.0)
LYMPHS PCT: 40.1 % (ref 12.0–46.0)
Lymphs Abs: 2.3 10*3/uL (ref 0.7–4.0)
MCHC: 32.3 g/dL (ref 30.0–36.0)
MCV: 83.3 fl (ref 78.0–100.0)
MONOS PCT: 6 % (ref 3.0–12.0)
Monocytes Absolute: 0.3 10*3/uL (ref 0.1–1.0)
Neutro Abs: 2.8 10*3/uL (ref 1.4–7.7)
Neutrophils Relative %: 49.7 % (ref 43.0–77.0)
Platelets: 301 10*3/uL (ref 150.0–400.0)
RBC: 5.42 Mil/uL (ref 4.22–5.81)
RDW: 14.1 % (ref 11.5–15.5)
WBC: 5.6 10*3/uL (ref 4.0–10.5)

## 2015-04-23 LAB — BASIC METABOLIC PANEL
BUN: 12 mg/dL (ref 6–23)
CALCIUM: 10 mg/dL (ref 8.4–10.5)
CO2: 31 mEq/L (ref 19–32)
CREATININE: 1.18 mg/dL (ref 0.40–1.50)
Chloride: 103 mEq/L (ref 96–112)
GFR: 81.2 mL/min (ref >60.00)
Glucose, Bld: 99 mg/dL (ref 70–99)
Potassium: 4.4 mEq/L (ref 3.5–5.1)
Sodium: 141 mEq/L (ref 135–145)

## 2015-04-23 LAB — TSH: TSH: 0.95 u[IU]/mL (ref 0.35–4.50)

## 2015-04-23 LAB — PSA: PSA: 3.39 ng/mL (ref 0.10–4.00)
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
## 2015-04-23 NOTE — Patient Instructions (Signed)
Today we updated your med list in our EPIC system...    Continue your current medications the same...  Today we checked your follow up CXR, EKG, & FASTING blood work...    We will contact you w/ the results when available...   Keep up the good work w/ your diet & exercise program...  Call for any questions...  Let's plan a follow up visit in [id number], sooner if needed for problems.Marland Kitchen..Marland Kitchen

## 2015-08-07 ENCOUNTER — Ambulatory Visit (INDEPENDENT_AMBULATORY_CARE_PROVIDER_SITE_OTHER): Payer: BLUE CROSS/BLUE SHIELD | Admitting: Family Medicine

## 2015-08-07 VITALS — BP 122/72 | HR 93 | Temp 98.7°F | Resp 17 | Ht 72.0 in | Wt 218.0 lb

## 2015-08-07 DIAGNOSIS — Z23 Encounter for immunization: Secondary | ICD-10-CM | POA: Diagnosis not present

## 2015-08-07 DIAGNOSIS — S61219A Laceration without foreign body of unspecified finger without damage to nail, initial encounter: Secondary | ICD-10-CM | POA: Diagnosis not present

## 2015-08-07 NOTE — Patient Instructions (Signed)
Please keep the wound clean.  You will have to wear waterproof gloves with handling contaminated materials.   Wash with soap and water twice a day, and immediately if the suture gets soiled.  Laceration Care, Adult A laceration is a cut that goes through all of the layers of the skin and into the tissue that is right under the skin. Some lacerations heal on their own. Others need to be closed with stitches (sutures), staples, skin adhesive strips, or skin glue. Proper laceration care minimizes the risk of infection and helps the laceration to heal better. HOW TO CARE FOR YOUR LACERATION If sutures or staples were used:  Keep the wound clean and dry.  If you were given a bandage (dressing), you should change it at least one time per day or as told by your health care provider. You should also change it if it becomes wet or dirty.  Keep the wound completely dry for the first 24 hours or as told by your health care provider. After that time, you may shower or bathe. However, make sure that the wound is not soaked in water until after the sutures or staples have been removed.  Clean the wound one time each day or as told by your health care provider:  Wash the wound with soap and water.  Rinse the wound with water to remove all soap.  Pat the wound dry with a clean towel. Do not rub the wound.  After cleaning the wound, apply a thin layer of antibiotic ointmentas told by your health care provider. This will help to prevent infection and keep the dressing from sticking to the wound.  Have the sutures or staples removed as told by your health care provider. If skin adhesive strips were used:  Keep the wound clean and dry.  If you were given a bandage (dressing), you should change it at least one time per day or as told by your health care provider. You should also change it if it becomes dirty or wet.  Do not get the skin adhesive strips wet. You may shower or bathe, but be careful to keep  the wound dry.  If the wound gets wet, pat it dry with a clean towel. Do not rub the wound.  Skin adhesive strips fall off on their own. You may trim the strips as the wound heals. Do not remove skin adhesive strips that are still stuck to the wound. They will fall off in time. If skin glue was used:  Try to keep the wound dry, but you may briefly wet it in the shower or bath. Do not soak the wound in water, such as by swimming.  After you have showered or bathed, gently pat the wound dry with a clean towel. Do not rub the wound.  Do not do any activities that will make you sweat heavily until the skin glue has fallen off on its own.  Do not apply liquid, cream, or ointment medicine to the wound while the skin glue is in place. Using those may loosen the film before the wound has healed.  If you were given a bandage (dressing), you should change it at least one time per day or as told by your health care provider. You should also change it if it becomes dirty or wet.  If a dressing is placed over the wound, be careful not to apply tape directly over the skin glue. Doing that may cause the glue to be pulled off before  the wound has healed.  Do not pick at the glue. The skin glue usually remains in place for 5-10 days, then it falls off of the skin. General Instructions  Take over-the-counter and prescription medicines only as told by your health care provider.  If you were prescribed an antibiotic medicine or ointment, take or apply it as told by your doctor. Do not stop using it even if your condition improves.  To help prevent scarring, make sure to cover your wound with sunscreen whenever you are outside after stitches are removed, after adhesive strips are removed, or when glue remains in place and the wound is healed. Make sure to wear a sunscreen of at least 30 SPF.  Do not scratch or pick at the wound.  Keep all follow-up visits as told by your health care provider. This is  important.  Check your wound every day for signs of infection. Watch for:  Redness, swelling, or pain.  Fluid, blood, or pus.  Raise (elevate) the injured area above the level of your heart while you are sitting or lying down, if possible. SEEK MEDICAL CARE IF:  You received a tetanus shot and you have swelling, severe pain, redness, or bleeding at the injection site.  You have a fever.  A wound that was closed breaks open.  You notice a bad smell coming from your wound or your dressing.  You notice something coming out of the wound, such as wood or glass.  Your pain is not controlled with medicine.  You have increased redness, swelling, or pain at the site of your wound.  You have fluid, blood, or pus coming from your wound.  You notice a change in the color of your skin near your wound.  You need to change the dressing frequently due to fluid, blood, or pus draining from the wound.  You develop a new rash.  You develop numbness around the wound. SEEK IMMEDIATE MEDICAL CARE IF:  You develop severe swelling around the wound.  Your pain suddenly increases and is severe.  You develop painful lumps near the wound or on skin that is anywhere on your body.  You have a red streak going away from your wound.  The wound is on your hand or foot and you cannot properly move a finger or toe.  The wound is on your hand or foot and you notice that your fingers or toes look pale or bluish.   This information is not intended to replace advice given to you by your health care provider. Make sure you discuss any questions you have with your health care provider.   Document Released: 06/23/2005 Document Revised: 11/07/2014 Document Reviewed: 06/19/2014 Elsevier Interactive Patient Education [employer]

## 2015-08-07 NOTE — Progress Notes (Signed)
Procedure: Verbal consent obtained.  Alcohol swab at the mcp location of 4th finger.  1% lidocaine applied to the 4th mcp for anesthesia.  After anesthesia obtained.  Wound cleansed with soap and water.  Wound searched with forceps--this does not extend to tendon.  Using 5-0 ethilon, 1 horizontal mattress, and 2 simple interrupted sutures were placed alont the 1cm laceration at distal left finger.  Wound cleansed with normal saline.  Dressings applied.
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
## 2015-08-20 ENCOUNTER — Ambulatory Visit (INDEPENDENT_AMBULATORY_CARE_PROVIDER_SITE_OTHER): Payer: BLUE CROSS/BLUE SHIELD | Admitting: Family Medicine

## 2015-08-20 ENCOUNTER — Ambulatory Visit: Payer: BLUE CROSS/BLUE SHIELD

## 2015-08-20 VITALS — BP 114/76 | HR 82 | Temp 99.0°F | Resp 17 | Ht 73.0 in | Wt 219.0 lb

## 2015-08-20 DIAGNOSIS — Z4802 Encounter for removal of sutures: Secondary | ICD-10-CM

## 2015-08-20 NOTE — Progress Notes (Signed)
   Subjective:    Patient ID: Adam Berry, male    DOB: March 28, 1956, 60 y.o.   MRN: [id number]  HPI Patient came in today for suture removal from a laceration of the left fourth finger on 08/07/15 . Patient denies pain, drainage, or swelling.   Review of Systems  Skin: Positive for wound.       Objective:   Physical Exam  Constitutional: He is oriented to person, place, and time. He appears well-developed and well-nourished.  HENT:  Head: Normocephalic.  Pulmonary/Chest: Effort normal.  Neurological: He is alert and oriented to person, place, and time.  Skin: Skin is warm.  Sutures removed from left fourth finger. Edges well approximated, no erythema, or swelling.  Psychiatric: He has a normal mood and affect. His behavior is normal. Judgment and thought content normal.     BP 114/76 mmHg  Pulse 82  Temp(Src) 99 F (37.2 C) (Oral)  Resp 17  Ht  (1.854 m)  Wt 219 lb (99.338 kg)  BMI 28.90 kg/m2  SpO2 97%      Assessment & Plan:  1. Visit for suture removal - instructions provided for post suture removal care - RTC if drainage, swelling, erythema or pain   Olean Ree, FNP-BC  [facility]  08/21/2015 10:01 AM

## 2015-08-20 NOTE — Patient Instructions (Signed)

## 2016-04-15 ENCOUNTER — Encounter: Payer: Self-pay | Admitting: Internal Medicine

## 2016-04-22 ENCOUNTER — Other Ambulatory Visit (INDEPENDENT_AMBULATORY_CARE_PROVIDER_SITE_OTHER): Payer: BLUE CROSS/BLUE SHIELD

## 2016-04-22 ENCOUNTER — Ambulatory Visit (INDEPENDENT_AMBULATORY_CARE_PROVIDER_SITE_OTHER): Payer: BLUE CROSS/BLUE SHIELD | Admitting: Pulmonary Disease

## 2016-04-22 VITALS — BP 130/70 | HR 63 | Temp 98.3°F | Ht 72.0 in | Wt 218.0 lb

## 2016-04-22 DIAGNOSIS — E7800 Pure hypercholesterolemia, unspecified: Secondary | ICD-10-CM

## 2016-04-22 DIAGNOSIS — Z0000 Encounter for general adult medical examination without abnormal findings: Secondary | ICD-10-CM | POA: Diagnosis not present

## 2016-04-22 DIAGNOSIS — K5730 Diverticulosis of large intestine without perforation or abscess without bleeding: Secondary | ICD-10-CM

## 2016-04-22 LAB — TSH: TSH: 1.29 u[IU]/mL (ref 0.35–4.50)

## 2016-04-22 LAB — COMPREHENSIVE METABOLIC PANEL
ALBUMIN: 4.5 g/dL (ref 3.5–5.2)
ALT: 21 U/L (ref 0–53)
AST: 16 U/L (ref 0–37)
Alkaline Phosphatase: 76 U/L (ref 39–117)
BUN: 15 mg/dL (ref 6–23)
CHLORIDE: 103 meq/L (ref 96–112)
CO2: 30 mEq/L (ref 19–32)
CREATININE: 1.08 mg/dL (ref 0.40–1.50)
Calcium: 10.1 mg/dL (ref 8.4–10.5)
GFR: 89.63 mL/min (ref >60.00)
Glucose, Bld: 91 mg/dL (ref 70–99)
Potassium: 4.6 mEq/L (ref 3.5–5.1)
Sodium: 140 mEq/L (ref 135–145)
Total Bilirubin: 0.5 mg/dL (ref 0.2–1.2)
Total Protein: 7.9 g/dL (ref 6.0–8.3)

## 2016-04-22 LAB — TESTOSTERONE: Testosterone: 356.58 ng/dL (ref 300.00–890.00)

## 2016-04-22 LAB — CBC WITH DIFFERENTIAL/PLATELET
BASOS PCT: 0.4 % (ref 0.0–3.0)
Basophils Absolute: 0 10*3/uL (ref 0.0–0.1)
EOS PCT: 6.1 % — AB (ref 0.0–5.0)
Eosinophils Absolute: 0.4 10*3/uL (ref 0.0–0.7)
HEMATOCRIT: 44.9 % (ref 39.0–52.0)
HEMOGLOBIN: 14.8 g/dL (ref 13.0–17.0)
LYMPHS PCT: 31.6 % (ref 12.0–46.0)
Lymphs Abs: 1.9 10*3/uL (ref 0.7–4.0)
MCHC: 32.9 g/dL (ref 30.0–36.0)
MCV: 82.3 fl (ref 78.0–100.0)
MONOS PCT: 7.2 % (ref 3.0–12.0)
Monocytes Absolute: 0.4 10*3/uL (ref 0.1–1.0)
Neutro Abs: 3.2 10*3/uL (ref 1.4–7.7)
Neutrophils Relative %: 54.7 % (ref 43.0–77.0)
Platelets: 298 10*3/uL (ref 150.0–400.0)
RBC: 5.46 Mil/uL (ref 4.22–5.81)
RDW: 14.7 % (ref 11.5–15.5)
WBC: 5.9 10*3/uL (ref 4.0–10.5)

## 2016-04-22 LAB — LIPID PANEL
CHOLESTEROL: 222 mg/dL — AB (ref 0–200)
HDL: 50.3 mg/dL (ref >39.00)
LDL Cholesterol: 155 mg/dL — ABNORMAL HIGH (ref 0–99)
NonHDL: 171.51
TRIGLYCERIDES: 82 mg/dL (ref 0.0–149.0)
Total CHOL/HDL Ratio: 4
VLDL: 16.4 mg/dL (ref 0.0–40.0)

## 2016-04-22 LAB — PSA: PSA: 3.4 ng/mL (ref 0.10–4.00)

## 2016-04-22 NOTE — Patient Instructions (Signed)
Today we updated your med list in our EPIC system...    Continue your current medications the same...  Today we checked your follow up FASTING blood work...    We will contact you w/ the results when available...   We will arrange for a UROLOGY evaluation as discussed...  Keep up the good work w/ diet & exercise...  You should reconsider the indicated Pneumonia vaccinations and the annual FLU shots...    They have been proven to prevent disease and save lives...  Call for any questions...  Let's plan a follow up visit in 5275yr, sooner if needed for problems.Marland Kitchen..Marland Kitchen

## 2016-04-23 ENCOUNTER — Encounter: Payer: Self-pay | Admitting: Pulmonary Disease
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
## 2016-04-23 NOTE — Progress Notes (Signed)
Spoke with Pt. About his results and SN recs. Pt expressed he understood. No further questions at this time.

## 2016-05-22 ENCOUNTER — Ambulatory Visit (AMBULATORY_SURGERY_CENTER): Payer: Self-pay

## 2016-05-22 VITALS — Ht 73.0 in | Wt 216.8 lb

## 2016-05-22 DIAGNOSIS — Z1211 Encounter for screening for malignant neoplasm of colon: Secondary | ICD-10-CM

## 2016-05-22 MED ORDER — SUPREP BOWEL PREP KIT 17.5-3.13-1.6 GM/177ML PO SOLN: 1.0000 | Freq: Once | ORAL | 0 refills | Status: AC

## 2016-05-22 NOTE — Progress Notes (Signed)
No allergies to eggs or soy No past problems anesthesia Or airway issues No diet meds No home oxygen  Declined emmi

## 2016-06-12 ENCOUNTER — Encounter: Payer: BLUE CROSS/BLUE SHIELD | Admitting: Internal Medicine

## 2016-07-14 ENCOUNTER — Telehealth: Payer: Self-pay | Admitting: Pulmonary Disease

## 2016-07-14 NOTE — Telephone Encounter (Signed)
I spoke with pt and advised urology referral for nocturia and mild ED. He states he is ok and does not want to go to see the urologist. Please advise if there are any other recommendations. Thanks

## 2016-07-15 NOTE — Telephone Encounter (Signed)
Per SN--  Pt requested the referral when he was here for his appt.  If he does not want to keep the appt with urology, he can cancel this.  Pt is aware and he will cancel the appt.

## 2016-07-15 NOTE — Telephone Encounter (Signed)
SN please advise. thanks 

## 2016-07-29 ENCOUNTER — Ambulatory Visit (AMBULATORY_SURGERY_CENTER): Payer: Self-pay | Admitting: *Deleted

## 2016-07-29 VITALS — Ht 74.0 in | Wt 224.2 lb

## 2016-07-29 DIAGNOSIS — Z1211 Encounter for screening for malignant neoplasm of colon: Secondary | ICD-10-CM

## 2016-07-29 MED ORDER — NA SULFATE-K SULFATE-MG SULF 17.5-3.13-1.6 GM/177ML PO SOLN: ORAL | 0 refills | Status: DC

## 2016-07-29 NOTE — Progress Notes (Signed)
No allergies to eggs or soy. No prior anesthesia; no problems with sedation  Pt given Emmi instructions for colonoscopy  No oxygen use  No diet drug use  

## 2016-08-07 ENCOUNTER — Ambulatory Visit (AMBULATORY_SURGERY_CENTER): Payer: BLUE CROSS/BLUE SHIELD | Admitting: Internal Medicine

## 2016-08-07 ENCOUNTER — Encounter: Payer: Self-pay | Admitting: Internal Medicine

## 2016-08-07 ENCOUNTER — Encounter: Payer: BLUE CROSS/BLUE SHIELD | Admitting: Internal Medicine

## 2016-08-07 VITALS — BP 116/79 | HR 69 | Temp 98.0°F | Resp 11 | Ht 74.0 in | Wt 224.0 lb

## 2016-08-07 DIAGNOSIS — Z1212 Encounter for screening for malignant neoplasm of rectum: Secondary | ICD-10-CM | POA: Diagnosis not present

## 2016-08-07 DIAGNOSIS — Z1211 Encounter for screening for malignant neoplasm of colon: Secondary | ICD-10-CM | POA: Diagnosis not present

## 2016-08-07 NOTE — Op Note (Signed)
[facility] Patient Name: Adam Berry Procedure Date: 08/07/2016 2:55 PM MRN: [id number] Endoscopist: Wilhemina Bonito. Marina Goodell , MD Age: 61 Referring MD:  Date of Birth: 08-Aug-1955 Gender: Male Account #: [id number] Procedure:                Colonoscopy Indications:              Screening for colorectal malignant neoplasm.                            Negative index exam 2007 Medicines:                Monitored Anesthesia Care Procedure:                Pre-Anesthesia Assessment:                           - Prior to the procedure, a History and Physical                            was performed, and patient medications and                            allergies were reviewed. The patient's tolerance of                            previous anesthesia was also reviewed. The risks                            and benefits of the procedure and the sedation                            options and risks were discussed with the patient.                            All questions were answered, and informed consent                            was obtained. Prior Anticoagulants: The patient has                            taken no previous anticoagulant or antiplatelet                            agents. ASA Grade Assessment: I - A normal, healthy                            patient. After reviewing the risks and benefits,                            the patient was deemed in satisfactory condition to                            undergo the procedure.  After obtaining informed consent, the colonoscope                            was passed under direct vision. Throughout the                            procedure, the patient's blood pressure, pulse, and                            oxygen saturations were monitored continuously. The                            Model CF-HQ190L [id number]) scope was introduced                            through the anus and advanced to the the cecum,                     identified by appendiceal orifice and ileocecal                            valve. The ileocecal valve, appendiceal orifice,                            and rectum were photographed. The quality of the                            bowel preparation was excellent. The colonoscopy                            was performed without difficulty. The patient                            tolerated the procedure well. The bowel preparation                            used was SUPREP. Scope In: 3:09:47 PM Scope Out: 3:19:25 PM Scope Withdrawal Time: 0 hours 7 minutes 42 seconds  Total Procedure Duration: 0 hours 9 minutes 38 seconds  Findings:                 Multiple medium-mouthed diverticula were found in                            the ascending colon and left colon.                           Internal hemorrhoids were found during retroflexion.                           The exam was otherwise without abnormality on                            direct and retroflexion views. Complications:            No immediate complications. Estimated blood loss:  None. Estimated Blood Loss:     Estimated blood loss: none. Impression:               - Diverticulosis in the ascending colon and in the                            left colon.                           - Internal hemorrhoids.                           - The examination was otherwise normal on direct                            and retroflexion views.                           - No specimens collected. Recommendation:           - Repeat colonoscopy in 10 years for screening                            purposes.                           - Patient has a contact number available for                            emergencies. The signs and symptoms of potential                            delayed complications were discussed with the                            patient. Return to normal activities tomorrow.                             Written discharge instructions were provided to the                            patient.                           - Resume previous diet.                           - Continue present medications. Wilhemina Bonito. Marina Goodell, MD 08/07/2016 3:22:52 PM This report has been signed electronically.

## 2016-08-07 NOTE — Progress Notes (Signed)
Patient awakening,vss,report to rn 

## 2016-08-07 NOTE — Patient Instructions (Signed)
YOU HAD AN ENDOSCOPIC PROCEDURE TODAY AT THE [facility]:   Refer to the procedure report that was given to you for any specific questions about what was found during the examination.  If the procedure report does not answer your questions, please call your gastroenterologist to clarify.  If you requested that your care partner not be given the details of your procedure findings, then the procedure report has been included in a sealed envelope for you to review at your convenience later.  YOU SHOULD EXPECT: Some feelings of bloating in the abdomen. Passage of more gas than usual.  Walking can help get rid of the air that was put into your GI tract during the procedure and reduce the bloating. If you had a lower endoscopy (such as a colonoscopy or flexible sigmoidoscopy) you may notice spotting of blood in your stool or on the toilet paper. If you underwent a bowel prep for your procedure, you may not have a normal bowel movement for a few days.  Please Note:  You might notice some irritation and congestion in your nose or some drainage.  This is from the oxygen used during your procedure.  There is no need for concern and it should clear up in a day or so.  SYMPTOMS TO REPORT IMMEDIATELY:   Following lower endoscopy (colonoscopy or flexible sigmoidoscopy):  Excessive amounts of blood in the stool  Significant tenderness or worsening of abdominal pains  Swelling of the abdomen that is new, acute  Fever of 100F or higher    For urgent or emergent issues, a gastroenterologist can be reached at any hour by calling [phone].   DIET:  We do recommend a small meal at first, but then you may proceed to your regular diet.  Drink plenty of fluids but you should avoid alcoholic beverages for 24 hours.  ACTIVITY:  You should plan to take it easy for the rest of today and you should NOT DRIVE or use heavy machinery until tomorrow (because of the sedation medicines used during the test).     FOLLOW UP: Our staff will call the number listed on your records the next business day following your procedure to check on you and address any questions or concerns that you may have regarding the information given to you following your procedure. If we do not reach you, we will leave a message.  However, if you are feeling well and you are not experiencing any problems, there is no need to return our call.  We will assume that you have returned to your regular daily activities without incident.  If any biopsies were taken you will be contacted by phone or by letter within the next 1-3 weeks.  Please call us at [phone] if you have not heard about the biopsies in 3 weeks.    SIGNATURES/CONFIDENTIALITY: You and/or your care partner have signed paperwork which will be entered into your electronic medical record.  These signatures attest to the fact that that the information above on your After Visit Summary has been reviewed and is understood.  Full responsibility of the confidentiality of this discharge information lies with you and/or your care-partner.  Resume medications. Information given on diverticulosis,hemorrhoids and high fiber diet. 

## 2016-08-08 ENCOUNTER — Telehealth: Payer: Self-pay

## 2016-08-08 NOTE — Telephone Encounter (Signed)
  Follow up Call-  Call back number 08/07/2016  Post procedure Call Back phone  # [phone]  Permission to leave phone message Yes  Some recent data might be hidden    Patient was called for follow up after his procedure on 08/07/2016. No answer at the number given for follow up phone call. A message was left on the answering machine.

## 2016-08-08 NOTE — Telephone Encounter (Signed)
  Follow up Call-  Call back number 08/07/2016  Post procedure Call Back phone  # [phone]  Permission to leave phone message Yes  Some recent data might be hidden     Patient questions:  Do you have a fever, pain , or abdominal swelling? No. Pain Score  0 *  Have you tolerated food without any problems? Yes.    Have you been able to return to your normal activities? Yes.    Do you have any questions about your discharge instructions: Diet   No. Medications  No. Follow up visit  No.  Do you have questions or concerns about your Care? No.  Actions: * If pain score is 4 or above: No action needed, pain <4.

## 2017-04-20 ENCOUNTER — Ambulatory Visit: Payer: BLUE CROSS/BLUE SHIELD | Admitting: Pulmonary Disease

## 2017-04-22 ENCOUNTER — Ambulatory Visit: Payer: BLUE CROSS/BLUE SHIELD | Admitting: Pulmonary Disease

## 2017-04-27 ENCOUNTER — Other Ambulatory Visit (INDEPENDENT_AMBULATORY_CARE_PROVIDER_SITE_OTHER): Payer: BLUE CROSS/BLUE SHIELD

## 2017-04-27 ENCOUNTER — Ambulatory Visit (INDEPENDENT_AMBULATORY_CARE_PROVIDER_SITE_OTHER)
Admission: RE | Admit: 2017-04-27 | Discharge: 2017-04-27 | Disposition: A | Discharge status: Home / Self Care | Payer: BLUE CROSS/BLUE SHIELD | Source: Ambulatory Visit | Attending: Pulmonary Disease | Admitting: Pulmonary Disease

## 2017-04-27 ENCOUNTER — Encounter: Payer: Self-pay | Admitting: Pulmonary Disease

## 2017-04-27 ENCOUNTER — Ambulatory Visit (INDEPENDENT_AMBULATORY_CARE_PROVIDER_SITE_OTHER): Payer: BLUE CROSS/BLUE SHIELD | Admitting: Pulmonary Disease

## 2017-04-27 VITALS — BP 118/78 | HR 65 | Temp 97.8°F | Ht 72.0 in | Wt 215.5 lb

## 2017-04-27 DIAGNOSIS — R972 Elevated prostate specific antigen [PSA]: Secondary | ICD-10-CM | POA: Diagnosis not present

## 2017-04-27 DIAGNOSIS — K5730 Diverticulosis of large intestine without perforation or abscess without bleeding: Secondary | ICD-10-CM

## 2017-04-27 DIAGNOSIS — Z0000 Encounter for general adult medical examination without abnormal findings: Secondary | ICD-10-CM

## 2017-04-27 DIAGNOSIS — E7800 Pure hypercholesterolemia, unspecified: Secondary | ICD-10-CM | POA: Diagnosis not present

## 2017-04-27 LAB — COMPREHENSIVE METABOLIC PANEL
ALT: 24 U/L (ref 0–53)
AST: 18 U/L (ref 0–37)
Albumin: 4.4 g/dL (ref 3.5–5.2)
Alkaline Phosphatase: 76 U/L (ref 39–117)
BUN: 11 mg/dL (ref 6–23)
CO2: 31 meq/L (ref 19–32)
Calcium: 10 mg/dL (ref 8.4–10.5)
Chloride: 100 mEq/L (ref 96–112)
Creatinine, Ser: 1.01 mg/dL (ref 0.40–1.50)
GFR: 96.51 mL/min (ref >60.00)
GLUCOSE: 93 mg/dL (ref 70–99)
POTASSIUM: 4.1 meq/L (ref 3.5–5.1)
Sodium: 139 mEq/L (ref 135–145)
Total Bilirubin: 0.5 mg/dL (ref 0.2–1.2)
Total Protein: 7.6 g/dL (ref 6.0–8.3)

## 2017-04-27 LAB — CBC WITH DIFFERENTIAL/PLATELET
Basophils Absolute: 0 10*3/uL (ref 0.0–0.1)
Basophils Relative: 0.8 % (ref 0.0–3.0)
EOS ABS: 0.3 10*3/uL (ref 0.0–0.7)
Eosinophils Relative: 5.5 % — ABNORMAL HIGH (ref 0.0–5.0)
HCT: 46 % (ref 39.0–52.0)
HEMOGLOBIN: 14.9 g/dL (ref 13.0–17.0)
LYMPHS ABS: 1.6 10*3/uL (ref 0.7–4.0)
Lymphocytes Relative: 31.5 % (ref 12.0–46.0)
MCHC: 32.4 g/dL (ref 30.0–36.0)
MCV: 84.5 fl (ref 78.0–100.0)
MONO ABS: 0.4 10*3/uL (ref 0.1–1.0)
Monocytes Relative: 7.1 % (ref 3.0–12.0)
NEUTROS PCT: 55.1 % (ref 43.0–77.0)
Neutro Abs: 2.7 10*3/uL (ref 1.4–7.7)
Platelets: 307 10*3/uL (ref 150.0–400.0)
RBC: 5.44 Mil/uL (ref 4.22–5.81)
RDW: 14.3 % (ref 11.5–15.5)
WBC: 5 10*3/uL (ref 4.0–10.5)

## 2017-04-27 LAB — PSA: PSA: 4.2 ng/mL — AB (ref 0.10–4.00)

## 2017-04-27 LAB — LIPID PANEL
Cholesterol: 231 mg/dL — ABNORMAL HIGH (ref 0–200)
HDL: 50.6 mg/dL (ref >39.00)
LDL CALC: 157 mg/dL — AB (ref 0–99)
NONHDL: 180
Total CHOL/HDL Ratio: 5
Triglycerides: 115 mg/dL (ref 0.0–149.0)
VLDL: 23 mg/dL (ref 0.0–40.0)

## 2017-04-27 LAB — TSH: TSH: 1.12 u[IU]/mL (ref 0.35–4.50)

## 2017-04-27 NOTE — Patient Instructions (Addendum)
Keep up the good work w/ diet & exercise...  Today we checked a follow up CXR & FASTING blood work...    We will contact you w/ the results when available...   If you want to recheck your cholesterol on the new supplement in a few months- just give us a call when you are ready...  Call for any questions...  Let's plan a follow up visit in [id number], sooner if needed for problems...   ADDENDUM>>  We discussed f/u FLP on his OTC supplement and PSA in several months.Marland Kitchen..Marland Kitchen
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
## 2017-06-02 ENCOUNTER — Telehealth: Payer: Self-pay | Admitting: Pulmonary Disease

## 2017-06-02 MED ORDER — AZITHROMYCIN 250 MG PO TABS: ORAL_TABLET | ORAL | 0 refills | Status: DC

## 2017-06-02 MED ORDER — PREDNISONE 5 MG (21) PO TBPK
ORAL_TABLET | ORAL | 0 refills | Status: DC
Start: 2017-06-02 — End: 2017-09-08

## 2017-06-02 NOTE — Telephone Encounter (Signed)
Called and spoke with pt. Pt reports of nasal drainage yellow in color & prod cough with yellow mucus 2wk Pt hasn't taken any OTC medications to help with symptoms.  SN please advise. Thanks.

## 2017-06-02 NOTE — Telephone Encounter (Signed)
Per SN--  zpak pred dosepak  He can also use flonase or saline OTC.   I called and spoke with pt and he is aware of meds that have been sent to the pharmacy.

## 2017-06-03 ENCOUNTER — Encounter: Payer: Self-pay | Admitting: Pulmonary Disease

## 2017-06-03 ENCOUNTER — Telehealth: Payer: Self-pay | Admitting: Pulmonary Disease

## 2017-06-03 NOTE — Telephone Encounter (Signed)
Pt called and was informed that wk note ready to be picked up-tr

## 2017-06-03 NOTE — Telephone Encounter (Signed)
Work excuse has been written by SN and placed in brown folder for pickup. Lm to make pt aware.

## 2017-06-03 NOTE — Telephone Encounter (Signed)
SN can I write a note for pt to excuse from work due to possible reaction from medication? Please advise.

## 2017-06-05 ENCOUNTER — Telehealth: Payer: Self-pay | Admitting: Pulmonary Disease

## 2017-06-05 NOTE — Telephone Encounter (Signed)
Pt came to the office, letter revised and nothing further is needed. I had to add the 27th to the letter.

## 2017-08-05 ENCOUNTER — Telehealth: Payer: Self-pay | Admitting: Pulmonary Disease

## 2017-08-05 ENCOUNTER — Other Ambulatory Visit: Payer: Self-pay | Admitting: Pulmonary Disease

## 2017-08-05 DIAGNOSIS — R972 Elevated prostate specific antigen [PSA]: Secondary | ICD-10-CM | POA: Insufficient documentation

## 2017-08-05 DIAGNOSIS — R351 Nocturia: Secondary | ICD-10-CM

## 2017-08-05 MED ORDER — TADALAFIL 20 MG PO TABS: 20.0000 mg | ORAL_TABLET | Freq: Every day | ORAL | 0 refills | Status: DC | PRN

## 2017-08-05 NOTE — Telephone Encounter (Signed)
Called pt who stated he was given samples of Cialis 20mg  by SN in 2017 and he forgot about the samples and never used them.  When he found the samples today, they were unfortunately expired.  Pt is wanting to know if we need to send an Rx of Cialis 20mg  to his pharmacy.  Dr. Kriste BasqueNadel, please advise on this.  Thanks!

## 2017-08-05 NOTE — Telephone Encounter (Signed)
Called pt back to let him know we could send a Rx of cialis to his pharmacy.  Pt also stated to me that previously SN had said he could refer him to urology.  Dr. Kriste BasqueNadel agreed to have the referral placed. Rx sent to pt's pharmacy and referral placed for pt to see urology. Nothing further needed at this current time.

## 2017-09-08 ENCOUNTER — Encounter: Payer: Self-pay | Admitting: Pulmonary Disease

## 2017-09-08 ENCOUNTER — Ambulatory Visit (INDEPENDENT_AMBULATORY_CARE_PROVIDER_SITE_OTHER): Payer: BLUE CROSS/BLUE SHIELD | Admitting: Pulmonary Disease

## 2017-09-08 VITALS — BP 130/78 | HR 79 | Temp 97.9°F | Ht 72.0 in | Wt 217.4 lb

## 2017-09-08 DIAGNOSIS — K5730 Diverticulosis of large intestine without perforation or abscess without bleeding: Secondary | ICD-10-CM

## 2017-09-08 DIAGNOSIS — E7800 Pure hypercholesterolemia, unspecified: Secondary | ICD-10-CM | POA: Diagnosis not present

## 2017-09-08 DIAGNOSIS — R1310 Dysphagia, unspecified: Secondary | ICD-10-CM

## 2017-09-08 DIAGNOSIS — R972 Elevated prostate specific antigen [PSA]: Secondary | ICD-10-CM

## 2017-09-08 NOTE — Patient Instructions (Signed)
Today we updated your med list in our EPIC system...    Continue your current medications the same...  We discussed returning to our lab one day in about a month for your f/u lipid profile...    We will contact you w/ the results when available...   Call for any questions or if we can be of service in any way.Marland Kitchen..Marland Kitchen
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
## 2017-09-29 ENCOUNTER — Telehealth: Payer: Self-pay | Admitting: Pulmonary Disease

## 2017-09-29 NOTE — Telephone Encounter (Signed)
I called pt>   He had questions about generic Viagra 20mg  that was prescribed by Urology... I suggested that he contact them to discuss his side effect of the med, and to determine if another option might be avail to him...  SMN

## 2017-09-29 NOTE — Telephone Encounter (Signed)
SN aware. Stated he would call him.  Nothing further needed at this time.

## 2017-09-29 NOTE — Telephone Encounter (Signed)
Spoke with pt, he would like to speak with SN only. I tried to get the information but he would not go into detail with me. SN please advise.

## 2017-10-02 ENCOUNTER — Other Ambulatory Visit (INDEPENDENT_AMBULATORY_CARE_PROVIDER_SITE_OTHER): Payer: BLUE CROSS/BLUE SHIELD

## 2017-10-02 DIAGNOSIS — E7800 Pure hypercholesterolemia, unspecified: Secondary | ICD-10-CM

## 2017-10-02 LAB — LIPID PANEL
CHOL/HDL RATIO: 4
CHOLESTEROL: 182 mg/dL (ref 0–200)
HDL: 46.7 mg/dL (ref >39.00)
LDL Cholesterol: 110 mg/dL — ABNORMAL HIGH (ref 0–99)
NONHDL: 134.81
Triglycerides: 125 mg/dL (ref 0.0–149.0)
VLDL: 25 mg/dL (ref 0.0–40.0)

## 2017-10-02 LAB — COMPREHENSIVE METABOLIC PANEL
ALBUMIN: 4.1 g/dL (ref 3.5–5.2)
ALT: 20 U/L (ref 0–53)
AST: 18 U/L (ref 0–37)
Alkaline Phosphatase: 75 U/L (ref 39–117)
BUN: 15 mg/dL (ref 6–23)
CO2: 30 mEq/L (ref 19–32)
CREATININE: 1.11 mg/dL (ref 0.40–1.50)
Calcium: 9.6 mg/dL (ref 8.4–10.5)
Chloride: 104 mEq/L (ref 96–112)
GFR: 86.43 mL/min (ref >60.00)
GLUCOSE: 111 mg/dL — AB (ref 70–99)
Potassium: 4 mEq/L (ref 3.5–5.1)
SODIUM: 140 meq/L (ref 135–145)
Total Bilirubin: 0.4 mg/dL (ref 0.2–1.2)
Total Protein: 7.6 g/dL (ref 6.0–8.3)

## 2017-10-05 ENCOUNTER — Telehealth: Payer: Self-pay | Admitting: Pulmonary Disease

## 2017-10-05 NOTE — Telephone Encounter (Signed)
  Michele McalpineNadel, Scott M, MD  Jacquiline Doerogdon, Lisa M, LPN        Please notify patient>   Labs from 10/02/17 showed FLP is much improved on his "[facility]" supplement med- TChol=182 (goal<200), LDL=110 (down from 157 & close to goal of <100)...  Chems are ok x BS=111 w/ goal<100...  REC> Needs better low carb low fat weight reducing diet!!! Continue the supplement - it IS working...    Pt is aware of results and voiced his understanding. Nothing further is needed.

## 2017-10-05 NOTE — Telephone Encounter (Signed)
Called patient on numbers provided. Unable to reach patient. Left message for patient give us a call back in regards to lab work.

## 2017-10-05 NOTE — Telephone Encounter (Signed)
Patient returned call, [phone].

## 2018-01-15 ENCOUNTER — Telehealth: Payer: Self-pay | Admitting: Pulmonary Disease

## 2018-01-15 NOTE — Telephone Encounter (Signed)
Called and spoke with patient regarding refill on Cialis 20mg  Pt requesting samples of Cialis, advised pt that we do not have samples at the office. Pt would like to know how much it would be for Cialis. I advised pt to call his pharmacy to see the cost of the medication. Nothing further needed at this time.

## 2018-01-21 ENCOUNTER — Telehealth: Payer: Self-pay | Admitting: Pulmonary Disease

## 2018-01-21 MED ORDER — TADALAFIL 20 MG PO TABS: 20.0000 mg | ORAL_TABLET | Freq: Every day | ORAL | 0 refills | Status: DC | PRN

## 2018-01-21 NOTE — Telephone Encounter (Signed)
Spoke with pt. He is needing a refill on Cialis. Rx has been sent in. Nothing further was needed.

## 2018-04-27 ENCOUNTER — Ambulatory Visit (INDEPENDENT_AMBULATORY_CARE_PROVIDER_SITE_OTHER)
Admission: RE | Admit: 2018-04-27 | Discharge: 2018-04-27 | Disposition: A | Discharge status: Home / Self Care | Payer: BLUE CROSS/BLUE SHIELD | Source: Ambulatory Visit | Attending: Pulmonary Disease | Admitting: Pulmonary Disease

## 2018-04-27 ENCOUNTER — Encounter: Payer: Self-pay | Admitting: Pulmonary Disease

## 2018-04-27 ENCOUNTER — Other Ambulatory Visit (INDEPENDENT_AMBULATORY_CARE_PROVIDER_SITE_OTHER): Payer: BLUE CROSS/BLUE SHIELD

## 2018-04-27 ENCOUNTER — Ambulatory Visit: Payer: BLUE CROSS/BLUE SHIELD | Admitting: Pulmonary Disease

## 2018-04-27 VITALS — BP 128/72 | HR 96 | Temp 98.1°F | Ht 72.0 in | Wt 198.8 lb

## 2018-04-27 DIAGNOSIS — E7800 Pure hypercholesterolemia, unspecified: Secondary | ICD-10-CM

## 2018-04-27 DIAGNOSIS — K5730 Diverticulosis of large intestine without perforation or abscess without bleeding: Secondary | ICD-10-CM

## 2018-04-27 DIAGNOSIS — Z0000 Encounter for general adult medical examination without abnormal findings: Secondary | ICD-10-CM | POA: Diagnosis not present

## 2018-04-27 DIAGNOSIS — R1310 Dysphagia, unspecified: Secondary | ICD-10-CM | POA: Diagnosis not present

## 2018-04-27 DIAGNOSIS — R972 Elevated prostate specific antigen [PSA]: Secondary | ICD-10-CM

## 2018-04-27 LAB — CBC WITH DIFFERENTIAL/PLATELET
BASOS ABS: 0 10*3/uL (ref 0.0–0.1)
Basophils Relative: 0.7 % (ref 0.0–3.0)
EOS ABS: 0.2 10*3/uL (ref 0.0–0.7)
EOS PCT: 3.9 % (ref 0.0–5.0)
HCT: 42.8 % (ref 39.0–52.0)
HEMOGLOBIN: 14.3 g/dL (ref 13.0–17.0)
LYMPHS ABS: 1.4 10*3/uL (ref 0.7–4.0)
Lymphocytes Relative: 28.1 % (ref 12.0–46.0)
MCHC: 33.4 g/dL (ref 30.0–36.0)
MCV: 84.3 fl (ref 78.0–100.0)
MONO ABS: 0.4 10*3/uL (ref 0.1–1.0)
Monocytes Relative: 7.6 % (ref 3.0–12.0)
NEUTROS PCT: 59.7 % (ref 43.0–77.0)
Neutro Abs: 2.9 10*3/uL (ref 1.4–7.7)
Platelets: 307 10*3/uL (ref 150.0–400.0)
RBC: 5.08 Mil/uL (ref 4.22–5.81)
RDW: 14.3 % (ref 11.5–15.5)
WBC: 4.9 10*3/uL (ref 4.0–10.5)

## 2018-04-27 LAB — COMPREHENSIVE METABOLIC PANEL
ALT: 19 U/L (ref 0–53)
AST: 15 U/L (ref 0–37)
Albumin: 4.3 g/dL (ref 3.5–5.2)
Alkaline Phosphatase: 64 U/L (ref 39–117)
BUN: 12 mg/dL (ref 6–23)
CHLORIDE: 101 meq/L (ref 96–112)
CO2: 31 meq/L (ref 19–32)
Calcium: 9.8 mg/dL (ref 8.4–10.5)
Creatinine, Ser: 1 mg/dL (ref 0.40–1.50)
GFR: 97.31 mL/min (ref >60.00)
GLUCOSE: 95 mg/dL (ref 70–99)
POTASSIUM: 3.9 meq/L (ref 3.5–5.1)
SODIUM: 138 meq/L (ref 135–145)
Total Bilirubin: 0.5 mg/dL (ref 0.2–1.2)
Total Protein: 7.4 g/dL (ref 6.0–8.3)

## 2018-04-27 LAB — LIPID PANEL
CHOL/HDL RATIO: 3
CHOLESTEROL: 176 mg/dL (ref 0–200)
HDL: 55.9 mg/dL (ref >39.00)
LDL CALC: 104 mg/dL — AB (ref 0–99)
NONHDL: 120.25
Triglycerides: 79 mg/dL (ref 0.0–149.0)
VLDL: 15.8 mg/dL (ref 0.0–40.0)

## 2018-04-27 LAB — TSH: TSH: 0.95 u[IU]/mL (ref 0.35–4.50)
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
## 2018-04-27 NOTE — Patient Instructions (Signed)
Today we updated your med list in our EPIC system...    Continue your current medications the same...  Today we did your follow up CXR, EKG, and FASTING blood work...    We will contact you w/ the results when available...   Keep up the great work w/ diet & exercise!  We discussed my up-coming retirement & options for primary care near your home-    Call the [place] office of [facility] at your convenience to establish with a new provider in that office...  Adam Berry,  It has been my great honor to have been one of your doctors over these many years...    Best wishes for continued good health & much happiness in the years to come.Marland KitchenMarland Kitchen

## 2018-10-14 IMAGING — DX DG CHEST 2V
2 series · 2 of 2 positions shown · non-contrast
Comparison: Radiographs April 23, 2015.

CLINICAL DATA: Annual physical exam.

EXAM:
CHEST  2 VIEW

[chest pa]
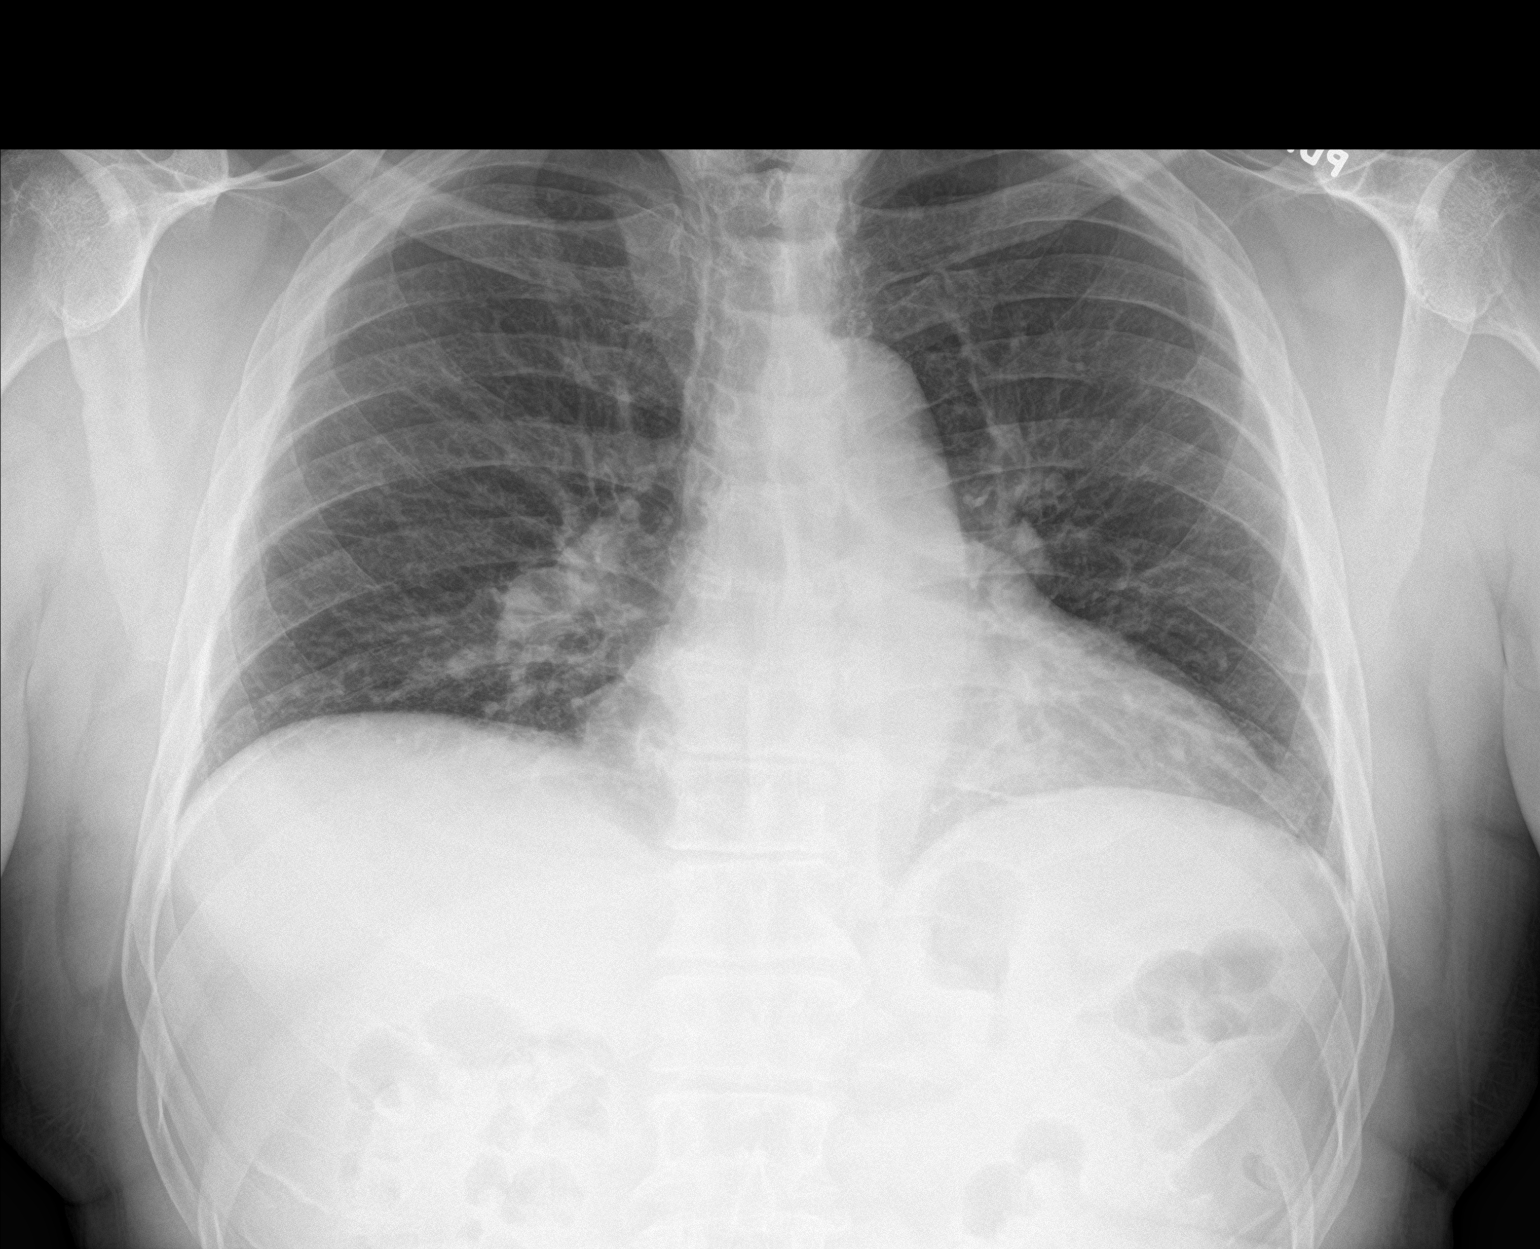

[chest lat]
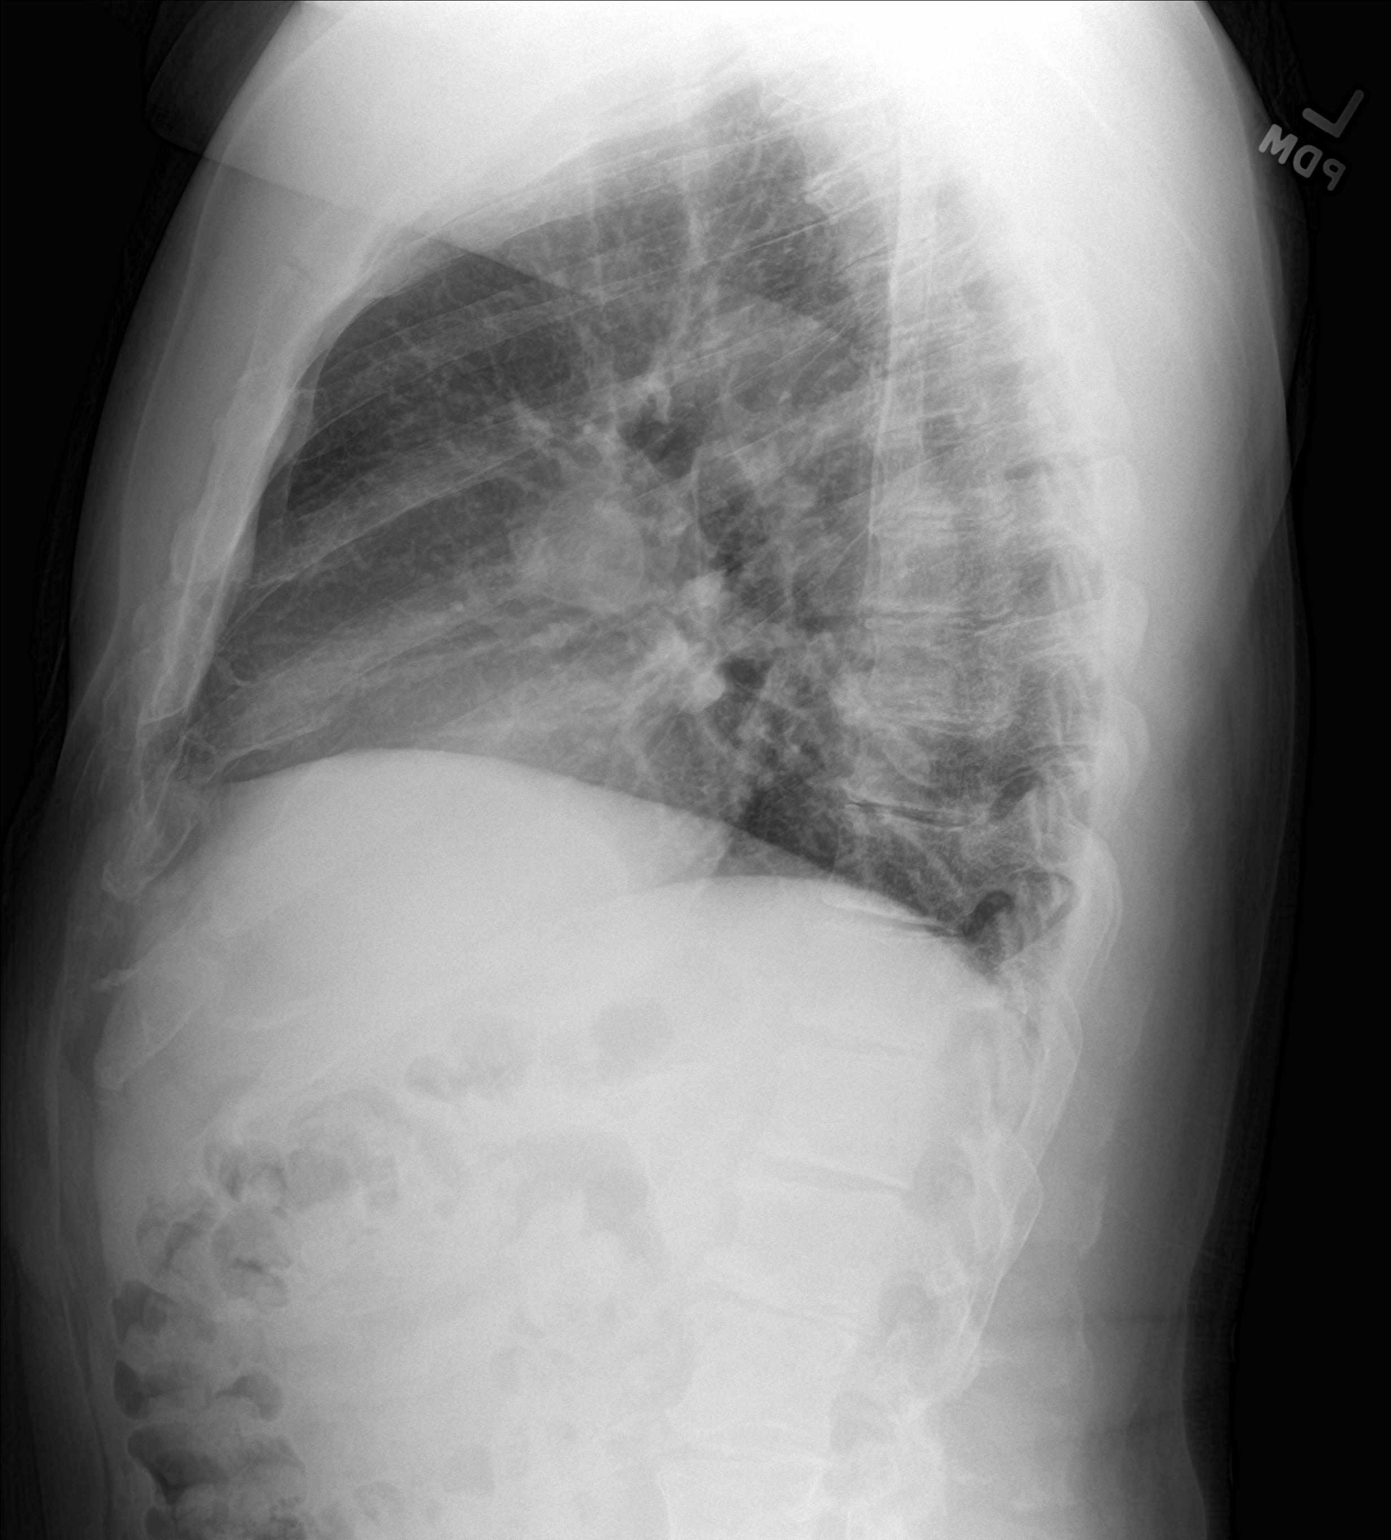

[2 of 2 positions shown; findings below may reference images not displayed]

FINDINGS: Stable cardiomediastinal silhouette. No pneumothorax or pleural
effusion is noted. Both lungs are clear. The visualized skeletal
structures are unremarkable.
IMPRESSION: No active cardiopulmonary disease.

## 2019-01-27 ENCOUNTER — Encounter: Payer: Self-pay | Admitting: Internal Medicine

## 2019-01-27 ENCOUNTER — Ambulatory Visit (INDEPENDENT_AMBULATORY_CARE_PROVIDER_SITE_OTHER): Payer: BC Managed Care – PPO | Admitting: Internal Medicine

## 2019-01-27 ENCOUNTER — Other Ambulatory Visit: Payer: Self-pay

## 2019-01-27 VITALS — BP 124/82 | HR 67 | Temp 97.3°F | Ht 72.5 in | Wt 201.8 lb

## 2019-01-27 DIAGNOSIS — Z136 Encounter for screening for cardiovascular disorders: Secondary | ICD-10-CM | POA: Diagnosis not present

## 2019-01-27 DIAGNOSIS — Z8249 Family history of ischemic heart disease and other diseases of the circulatory system: Secondary | ICD-10-CM

## 2019-01-27 DIAGNOSIS — Z1389 Encounter for screening for other disorder: Secondary | ICD-10-CM

## 2019-01-27 DIAGNOSIS — R350 Frequency of micturition: Secondary | ICD-10-CM

## 2019-01-27 DIAGNOSIS — R7309 Other abnormal glucose: Secondary | ICD-10-CM

## 2019-01-27 DIAGNOSIS — E782 Mixed hyperlipidemia: Secondary | ICD-10-CM

## 2019-01-27 DIAGNOSIS — R030 Elevated blood-pressure reading, without diagnosis of hypertension: Secondary | ICD-10-CM

## 2019-01-27 DIAGNOSIS — Z111 Encounter for screening for respiratory tuberculosis: Secondary | ICD-10-CM

## 2019-01-27 DIAGNOSIS — Z1211 Encounter for screening for malignant neoplasm of colon: Secondary | ICD-10-CM

## 2019-01-27 DIAGNOSIS — E559 Vitamin D deficiency, unspecified: Secondary | ICD-10-CM

## 2019-01-27 DIAGNOSIS — Z131 Encounter for screening for diabetes mellitus: Secondary | ICD-10-CM | POA: Diagnosis not present

## 2019-01-27 DIAGNOSIS — N401 Enlarged prostate with lower urinary tract symptoms: Secondary | ICD-10-CM | POA: Diagnosis not present

## 2019-01-27 DIAGNOSIS — Z79899 Other long term (current) drug therapy: Secondary | ICD-10-CM

## 2019-01-27 DIAGNOSIS — R972 Elevated prostate specific antigen [PSA]: Secondary | ICD-10-CM

## 2019-01-27 DIAGNOSIS — Z0000 Encounter for general adult medical examination without abnormal findings: Secondary | ICD-10-CM

## 2019-01-27 DIAGNOSIS — Z13220 Encounter for screening for lipoid disorders: Secondary | ICD-10-CM

## 2019-01-27 DIAGNOSIS — Z125 Encounter for screening for malignant neoplasm of prostate: Secondary | ICD-10-CM | POA: Diagnosis not present

## 2019-01-27 DIAGNOSIS — R5383 Other fatigue: Secondary | ICD-10-CM

## 2019-01-27 DIAGNOSIS — Z0001 Encounter for general adult medical examination with abnormal findings: Secondary | ICD-10-CM

## 2019-01-27 DIAGNOSIS — R0789 Other chest pain: Secondary | ICD-10-CM
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
## 2019-01-27 NOTE — Patient Instructions (Addendum)

## 2019-01-28 LAB — CBC WITH DIFFERENTIAL/PLATELET
Absolute Monocytes: 370 cells/uL (ref 200–950)
Basophils Absolute: 29 cells/uL (ref 0–200)
Basophils Relative: 0.6 %
Eosinophils Absolute: 350 cells/uL (ref 15–500)
Eosinophils Relative: 7.3 %
HCT: 42.3 % (ref 38.5–50.0)
Hemoglobin: 14 g/dL (ref 13.2–17.1)
Lymphs Abs: 1651 cells/uL (ref 850–3900)
MCH: 28.1 pg (ref 27.0–33.0)
MCHC: 33.1 g/dL (ref 32.0–36.0)
MCV: 84.9 fL (ref 80.0–100.0)
MPV: 9.3 fL (ref 7.5–12.5)
Monocytes Relative: 7.7 %
Neutro Abs: 2400 cells/uL (ref 1500–7800)
Neutrophils Relative %: 50 %
Platelets: 307 10*3/uL (ref 140–400)
RBC: 4.98 10*6/uL (ref 4.20–5.80)
RDW: 13.1 % (ref 11.0–15.0)
Total Lymphocyte: 34.4 %
WBC: 4.8 10*3/uL (ref 3.8–10.8)

## 2019-01-28 LAB — COMPLETE METABOLIC PANEL WITH GFR
AG Ratio: 1.6 (calc) (ref 1.0–2.5)
ALT: 17 U/L (ref 9–46)
AST: 17 U/L (ref 10–35)
Albumin: 4.5 g/dL (ref 3.6–5.1)
Alkaline phosphatase (APISO): 61 U/L (ref 35–144)
BUN: 11 mg/dL (ref 7–25)
CO2: 29 mmol/L (ref 20–32)
Calcium: 10 mg/dL (ref 8.6–10.3)
Chloride: 102 mmol/L (ref 98–110)
Creat: 1.07 mg/dL (ref 0.70–1.25)
GFR, Est African American: 86 mL/min/{1.73_m2} (ref >=60)
GFR, Est Non African American: 74 mL/min/{1.73_m2} (ref >=60)
Globulin: 2.9 g/dL (calc) (ref 1.9–3.7)
Glucose, Bld: 91 mg/dL (ref 65–99)
Potassium: 4.3 mmol/L (ref 3.5–5.3)
Sodium: 139 mmol/L (ref 135–146)
Total Bilirubin: 0.6 mg/dL (ref 0.2–1.2)
Total Protein: 7.4 g/dL (ref 6.1–8.1)

## 2019-01-28 LAB — URINALYSIS, ROUTINE W REFLEX MICROSCOPIC
Bacteria, UA: NONE SEEN /HPF
Bilirubin Urine: NEGATIVE
Glucose, UA: NEGATIVE
Hgb urine dipstick: NEGATIVE
Hyaline Cast: NONE SEEN /LPF
Ketones, ur: NEGATIVE
Leukocytes,Ua: NEGATIVE
Nitrite: NEGATIVE
Specific Gravity, Urine: 1.023 (ref 1.001–1.03)
Squamous Epithelial / LPF: NONE SEEN /HPF (ref <=5)
WBC, UA: NONE SEEN /HPF (ref 0–5)
pH: 7.5 (ref 5.0–8.0)

## 2019-01-28 LAB — LIPID PANEL
Cholesterol: 219 mg/dL — ABNORMAL HIGH (ref <200)
HDL: 59 mg/dL (ref >=40)
LDL Cholesterol (Calc): 142 mg/dL (calc) — ABNORMAL HIGH
Non-HDL Cholesterol (Calc): 160 mg/dL (calc) — ABNORMAL HIGH (ref <130)
Total CHOL/HDL Ratio: 3.7 (calc) (ref <5.0)
Triglycerides: 74 mg/dL (ref <150)

## 2019-01-28 LAB — HEMOGLOBIN A1C
Hgb A1c MFr Bld: 5.6 % of total Hgb (ref <5.7)
Mean Plasma Glucose: 114 (calc)
eAG (mmol/L): 6.3 (calc)

## 2019-01-28 LAB — TSH: TSH: 1.12 mIU/L (ref 0.40–4.50)

## 2019-01-28 LAB — PSA: PSA: 3.1 ng/mL (ref <=4.0)

## 2019-01-28 LAB — VITAMIN D 25 HYDROXY (VIT D DEFICIENCY, FRACTURES): Vit D, 25-Hydroxy: 40 ng/mL (ref 30–100)

## 2019-01-29 ENCOUNTER — Encounter: Payer: Self-pay | Admitting: Internal Medicine

## 2019-01-29 ENCOUNTER — Other Ambulatory Visit: Payer: Self-pay | Admitting: Internal Medicine

## 2019-01-29 DIAGNOSIS — E782 Mixed hyperlipidemia: Secondary | ICD-10-CM

## 2019-01-29 MED ORDER — ROSUVASTATIN CALCIUM 20 MG PO TABS: ORAL_TABLET | ORAL | 1 refills | Status: DC

## 2019-01-31 ENCOUNTER — Encounter: Payer: Self-pay | Admitting: Internal Medicine

## 2019-01-31 ENCOUNTER — Other Ambulatory Visit: Payer: Self-pay | Admitting: Internal Medicine

## 2019-01-31 DIAGNOSIS — Z9289 Personal history of other medical treatment: Secondary | ICD-10-CM | POA: Insufficient documentation

## 2019-01-31 LAB — TB SKIN TEST
Induration: 20 mm
TB Skin Test: POSITIVE

## 2019-04-28 DIAGNOSIS — N529 Male erectile dysfunction, unspecified: Secondary | ICD-10-CM | POA: Insufficient documentation
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
## 2019-05-04 ENCOUNTER — Other Ambulatory Visit: Payer: Self-pay

## 2019-05-04 ENCOUNTER — Ambulatory Visit (INDEPENDENT_AMBULATORY_CARE_PROVIDER_SITE_OTHER): Payer: PRIVATE HEALTH INSURANCE | Admitting: Adult Health

## 2019-05-04 ENCOUNTER — Encounter: Payer: Self-pay | Admitting: Adult Health

## 2019-05-04 VITALS — BP 106/62 | HR 86 | Temp 96.4°F | Ht 72.5 in | Wt 204.6 lb

## 2019-05-04 DIAGNOSIS — Z6826 Body mass index (BMI) 26.0-26.9, adult: Secondary | ICD-10-CM | POA: Diagnosis not present

## 2019-05-04 DIAGNOSIS — R7309 Other abnormal glucose: Secondary | ICD-10-CM

## 2019-05-04 DIAGNOSIS — N529 Male erectile dysfunction, unspecified: Secondary | ICD-10-CM | POA: Diagnosis not present

## 2019-05-04 DIAGNOSIS — Z79899 Other long term (current) drug therapy: Secondary | ICD-10-CM

## 2019-05-04 DIAGNOSIS — E782 Mixed hyperlipidemia: Secondary | ICD-10-CM | POA: Diagnosis not present

## 2019-05-04 NOTE — Patient Instructions (Addendum)
Goals    . LDL CALC < 130       Try adding red yeast rice  Try adding daily oat meal and/or soluble fiber - citrucel/benefiber   Try reading labels - try to reduce saturated/trans fats  - cut back on dairy fat (cheese, high fat dairy, high fat animal products)  - try to limit red meat - beef, lamb, pork   - choose oils low in saturated and trans fats (olive oil, avocado oil)  - be aware that most "processed" foods that come in packages use high saturated fats - read labels closely, choose products that use olive oil or avocado oil instead when possible (mayonnaise, salad dressings, etc)     Preventing High Cholesterol Cholesterol is a white, waxy substance similar to fat that the human body needs to help build cells. The liver makes all the cholesterol that a person's body needs. Having high cholesterol (hypercholesterolemia) increases a person's risk for heart disease and stroke. Extra (excess) cholesterol comes from the food the person eats. High cholesterol can often be prevented with diet and lifestyle changes. If you already have high cholesterol, you can control it with diet and lifestyle changes and with medicine. How can high cholesterol affect me? If you have high cholesterol, deposits (plaques) may build up on the walls of your arteries. The arteries are the blood vessels that carry blood away from your heart. Plaques make the arteries narrower and stiffer. This can limit or block blood flow and cause blood clots to form. Blood clots:  Are tiny balls of cells that form in your blood.  Can move to the heart or brain, causing a heart attack or stroke. Plaques in arteries greatly increase your risk for heart attack and stroke.Making diet and lifestyle changes can reduce your risk for these conditions that may threaten your life. What can increase my risk? This condition is more likely to develop in people who:  Eat foods that are high in saturated fat or cholesterol.  Saturated fat is mostly found in: ? Foods that contain animal fat, such as red meat and some dairy products. ? Certain fatty foods made from plants, such as tropical oils.  Are overweight.  Are not getting enough exercise.  Have a family history of high cholesterol. What actions can I take to prevent this? Nutrition   Eat less saturated fat.  Avoid trans fats (partially hydrogenated oils). These are often found in margarine and in some baked goods, fried foods, and snacks bought in packages.  Avoid precooked or cured meat, such as sausages or meat loaves.  Avoid foods and drinks that have added sugars.  Eat more fruits, vegetables, and whole grains.  Choose healthy sources of protein, such as fish, poultry, lean cuts of red meat, beans, peas, lentils, and nuts.  Choose healthy sources of fat, such as: ? Nuts. ? Vegetable oils, especially olive oil. ? Fish that have healthy fats (omega-3 fatty acids), such as mackerel or salmon. The items listed above may not be a complete list of recommended foods and beverages. Contact a dietitian for more information. Lifestyle  Lose weight if you are overweight. Losing 5-10 lb (2.3-4.5 kg) can help prevent or control high cholesterol. It can also lower your risk for diabetes and high blood pressure. Ask your health care provider to help you with a diet and exercise plan to lose weight safely.  Do not use any products that contain nicotine or tobacco, such as cigarettes, e-cigarettes, and chewing tobacco.  If you need help quitting, ask your health care provider.  Limit your alcohol intake. ? Do not drink alcohol if:  Your health care provider tells you not to drink.  You are pregnant, may be pregnant, or are planning to become pregnant. ? If you drink alcohol:  Limit how much you use to:  0-1 drink a day for women.  0-2 drinks a day for men.  Be aware of how much alcohol is in your drink. In the [place] one drink equals one 12 oz  bottle of beer (355 mL), one 5 oz glass of wine (148 mL), or one 1 oz glass of hard liquor (44 mL). Activity   Get enough exercise. Each week, do at least 150 minutes of exercise that takes a medium level of effort (moderate-intensity exercise). ? This is exercise that:  Makes your heart beat faster and makes you breathe harder than usual.  Allows you to still be able to talk. ? You could exercise in short sessions several times a day or longer sessions a few times a week. For example, on 5 days each week, you could walk fast or ride your bike 3 times a day for 10 minutes each time.  Do exercises as told by your health care provider. Medicines  In addition to diet and lifestyle changes, your health care provider may recommend medicines to help lower cholesterol. This may be a medicine to lower the amount of cholesterol your liver makes. You may need medicine if: ? Diet and lifestyle changes do not lower your cholesterol enough. ? You have high cholesterol and other risk factors for heart disease or stroke.  Take over-the-counter and prescription medicines only as told by your health care provider. General information  Manage your risk factors for high cholesterol. Talk with your health care provider about all your risk factors and how to lower your risk.  Manage other conditions that you have, such as diabetes or high blood pressure (hypertension).  Have blood tests to check your cholesterol levels at regular points in time as told by your health care provider.  Keep all follow-up visits as told by your health care provider. This is important. Where to find more information  American Heart Association: www.heart.org  National Heart, Lung, and Blood Institute: [employer].is Summary  High cholesterol increases your risk for heart disease and stroke. By keeping your cholesterol level low, you can reduce your risk for these conditions.  High cholesterol can often be prevented  with diet and lifestyle changes.  Work with your health care provider to manage your risk factors, and have your blood tested regularly. This information is not intended to replace advice given to you by your health care provider. Make sure you discuss any questions you have with your health care provider. Document Released: 07/08/2015 Document Revised: 10/15/2018 Document Reviewed: 03/01/2016 Elsevier Patient Education  2020 [employer].

## 2019-05-05 LAB — COMPLETE METABOLIC PANEL WITH GFR
AG Ratio: 1.6 (calc) (ref 1.0–2.5)
ALT: 18 U/L (ref 9–46)
AST: 19 U/L (ref 10–35)
Albumin: 4.3 g/dL (ref 3.6–5.1)
Alkaline phosphatase (APISO): 66 U/L (ref 35–144)
BUN: 12 mg/dL (ref 7–25)
CO2: 29 mmol/L (ref 20–32)
Calcium: 10 mg/dL (ref 8.6–10.3)
Chloride: 103 mmol/L (ref 98–110)
Creat: 1.1 mg/dL (ref 0.70–1.25)
GFR, Est African American: 82 mL/min/{1.73_m2} (ref >=60)
GFR, Est Non African American: 71 mL/min/{1.73_m2} (ref >=60)
Globulin: 2.7 g/dL (calc) (ref 1.9–3.7)
Glucose, Bld: 90 mg/dL (ref 65–99)
Potassium: 4.4 mmol/L (ref 3.5–5.3)
Sodium: 140 mmol/L (ref 135–146)
Total Bilirubin: 0.5 mg/dL (ref 0.2–1.2)
Total Protein: 7 g/dL (ref 6.1–8.1)

## 2019-05-05 LAB — LIPID PANEL
Cholesterol: 215 mg/dL — ABNORMAL HIGH (ref <200)
HDL: 51 mg/dL (ref >=40)
LDL Cholesterol (Calc): 143 mg/dL (calc) — ABNORMAL HIGH
Non-HDL Cholesterol (Calc): 164 mg/dL (calc) — ABNORMAL HIGH (ref <130)
Total CHOL/HDL Ratio: 4.2 (calc) (ref <5.0)
Triglycerides: 97 mg/dL (ref <150)

## 2019-08-04 ENCOUNTER — Ambulatory Visit (INDEPENDENT_AMBULATORY_CARE_PROVIDER_SITE_OTHER): Payer: BC Managed Care – PPO | Admitting: Internal Medicine

## 2019-08-04 ENCOUNTER — Other Ambulatory Visit: Payer: Self-pay

## 2019-08-04 ENCOUNTER — Ambulatory Visit: Payer: PRIVATE HEALTH INSURANCE | Admitting: Internal Medicine

## 2019-08-04 VITALS — BP 126/82 | HR 60 | Temp 97.5°F | Resp 18 | Ht 72.05 in | Wt 211.8 lb

## 2019-08-04 DIAGNOSIS — E782 Mixed hyperlipidemia: Secondary | ICD-10-CM

## 2019-08-04 DIAGNOSIS — R7309 Other abnormal glucose: Secondary | ICD-10-CM

## 2019-08-04 DIAGNOSIS — R030 Elevated blood-pressure reading, without diagnosis of hypertension: Secondary | ICD-10-CM | POA: Diagnosis not present

## 2019-08-04 DIAGNOSIS — Z79899 Other long term (current) drug therapy: Secondary | ICD-10-CM

## 2019-08-04 DIAGNOSIS — E559 Vitamin D deficiency, unspecified: Secondary | ICD-10-CM | POA: Diagnosis not present
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
## 2019-08-04 NOTE — Patient Instructions (Signed)

## 2019-08-05 LAB — CBC WITH DIFFERENTIAL/PLATELET
Absolute Monocytes: 348 cells/uL (ref 200–950)
Basophils Absolute: 20 cells/uL (ref 0–200)
Basophils Relative: 0.5 %
Eosinophils Absolute: 280 cells/uL (ref 15–500)
Eosinophils Relative: 7 %
HCT: 38.8 % (ref 38.5–50.0)
Hemoglobin: 12.8 g/dL — ABNORMAL LOW (ref 13.2–17.1)
Lymphs Abs: 1404 cells/uL (ref 850–3900)
MCH: 27.7 pg (ref 27.0–33.0)
MCHC: 33 g/dL (ref 32.0–36.0)
MCV: 84 fL (ref 80.0–100.0)
MPV: 9.6 fL (ref 7.5–12.5)
Monocytes Relative: 8.7 %
Neutro Abs: 1948 cells/uL (ref 1500–7800)
Neutrophils Relative %: 48.7 %
Platelets: 268 10*3/uL (ref 140–400)
RBC: 4.62 10*6/uL (ref 4.20–5.80)
RDW: 13.3 % (ref 11.0–15.0)
Total Lymphocyte: 35.1 %
WBC: 4 10*3/uL (ref 3.8–10.8)

## 2019-08-05 LAB — MAGNESIUM: Magnesium: 1.9 mg/dL (ref 1.5–2.5)

## 2019-08-05 LAB — COMPLETE METABOLIC PANEL WITH GFR
AG Ratio: 1.8 (calc) (ref 1.0–2.5)
ALT: 17 U/L (ref 9–46)
AST: 17 U/L (ref 10–35)
Albumin: 4.2 g/dL (ref 3.6–5.1)
Alkaline phosphatase (APISO): 57 U/L (ref 35–144)
BUN: 12 mg/dL (ref 7–25)
CO2: 28 mmol/L (ref 20–32)
Calcium: 9.2 mg/dL (ref 8.6–10.3)
Chloride: 105 mmol/L (ref 98–110)
Creat: 1.05 mg/dL (ref 0.70–1.25)
GFR, Est African American: 87 mL/min/{1.73_m2} (ref >=60)
GFR, Est Non African American: 75 mL/min/{1.73_m2} (ref >=60)
Globulin: 2.4 g/dL (calc) (ref 1.9–3.7)
Glucose, Bld: 92 mg/dL (ref 65–99)
Potassium: 3.8 mmol/L (ref 3.5–5.3)
Sodium: 140 mmol/L (ref 135–146)
Total Bilirubin: 0.4 mg/dL (ref 0.2–1.2)
Total Protein: 6.6 g/dL (ref 6.1–8.1)

## 2019-08-05 LAB — LIPID PANEL
Cholesterol: 186 mg/dL (ref <200)
HDL: 52 mg/dL (ref >=40)
LDL Cholesterol (Calc): 119 mg/dL (calc) — ABNORMAL HIGH
Non-HDL Cholesterol (Calc): 134 mg/dL (calc) — ABNORMAL HIGH (ref <130)
Total CHOL/HDL Ratio: 3.6 (calc) (ref <5.0)
Triglycerides: 60 mg/dL (ref <150)

## 2019-08-05 LAB — TSH: TSH: 0.9 mIU/L (ref 0.40–4.50)

## 2019-08-05 LAB — INSULIN, RANDOM: Insulin: 6.3 u[IU]/mL

## 2019-08-05 LAB — VITAMIN D 25 HYDROXY (VIT D DEFICIENCY, FRACTURES): Vit D, 25-Hydroxy: 41 ng/mL (ref 30–100)

## 2019-08-05 LAB — HEMOGLOBIN A1C
Hgb A1c MFr Bld: 5.7 % of total Hgb — ABNORMAL HIGH (ref <5.7)
Mean Plasma Glucose: 117 (calc)
eAG (mmol/L): 6.5 (calc)

## 2019-08-06 ENCOUNTER — Encounter: Payer: Self-pay | Admitting: Internal Medicine

## 2019-09-19 ENCOUNTER — Ambulatory Visit (HOSPITAL_COMMUNITY)
Admission: EM | Admit: 2019-09-19 | Discharge: 2019-09-19 | Disposition: A | Discharge status: Home / Self Care | Payer: Worker's Compensation | Attending: Internal Medicine | Admitting: Internal Medicine

## 2019-09-19 ENCOUNTER — Encounter (HOSPITAL_COMMUNITY): Payer: Self-pay

## 2019-09-19 ENCOUNTER — Other Ambulatory Visit: Payer: Self-pay

## 2019-09-19 DIAGNOSIS — M79602 Pain in left arm: Secondary | ICD-10-CM
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
## 2019-09-19 NOTE — ED Triage Notes (Signed)
Patient states that he was working on a machine at work and felt something pop in his upper left arm. States that this happened on Friday night around midnight. Patient states that he has been stretching and that had provided some relief but pain now seems to be worsening.

## 2019-10-14 IMAGING — DX DG CHEST 2V
2 series · 2 of 2 positions shown · non-contrast
Comparison: Radiographs April 27, 2017.

CLINICAL DATA: Annual physical exam.

EXAM:
CHEST - 2 VIEW

[chest pa]
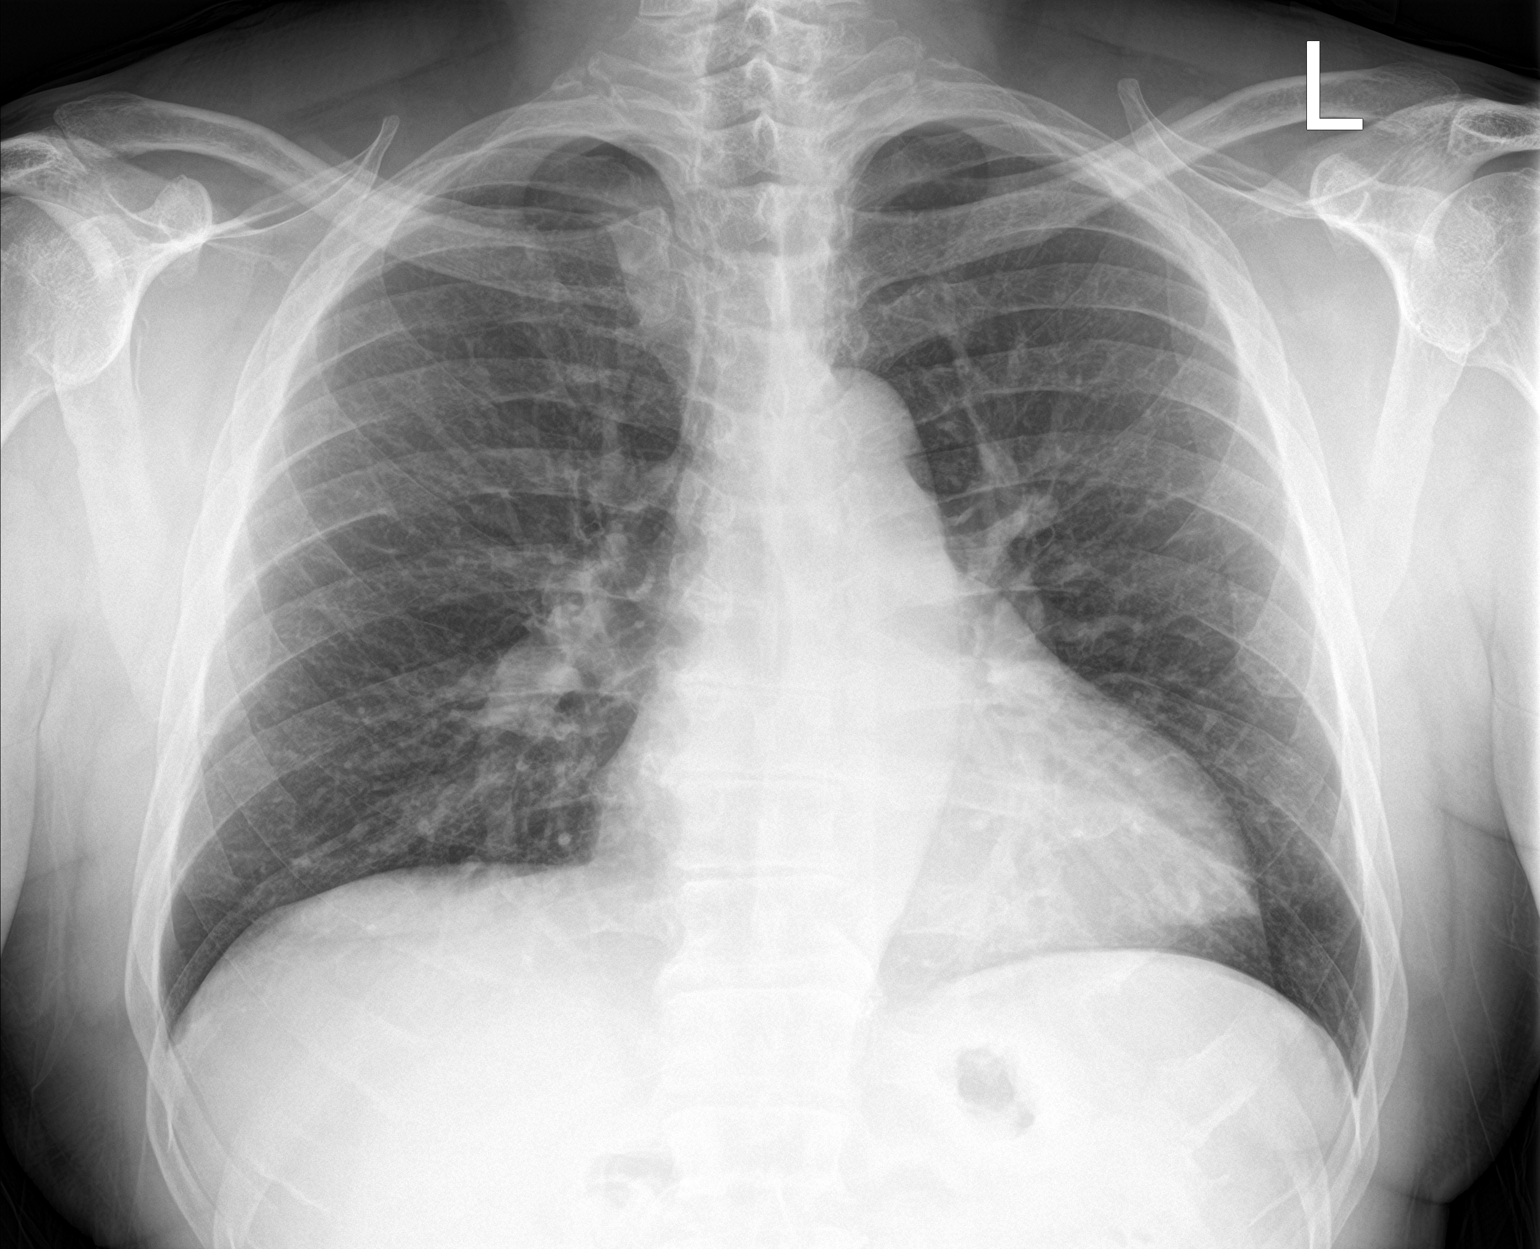

[chest lat]
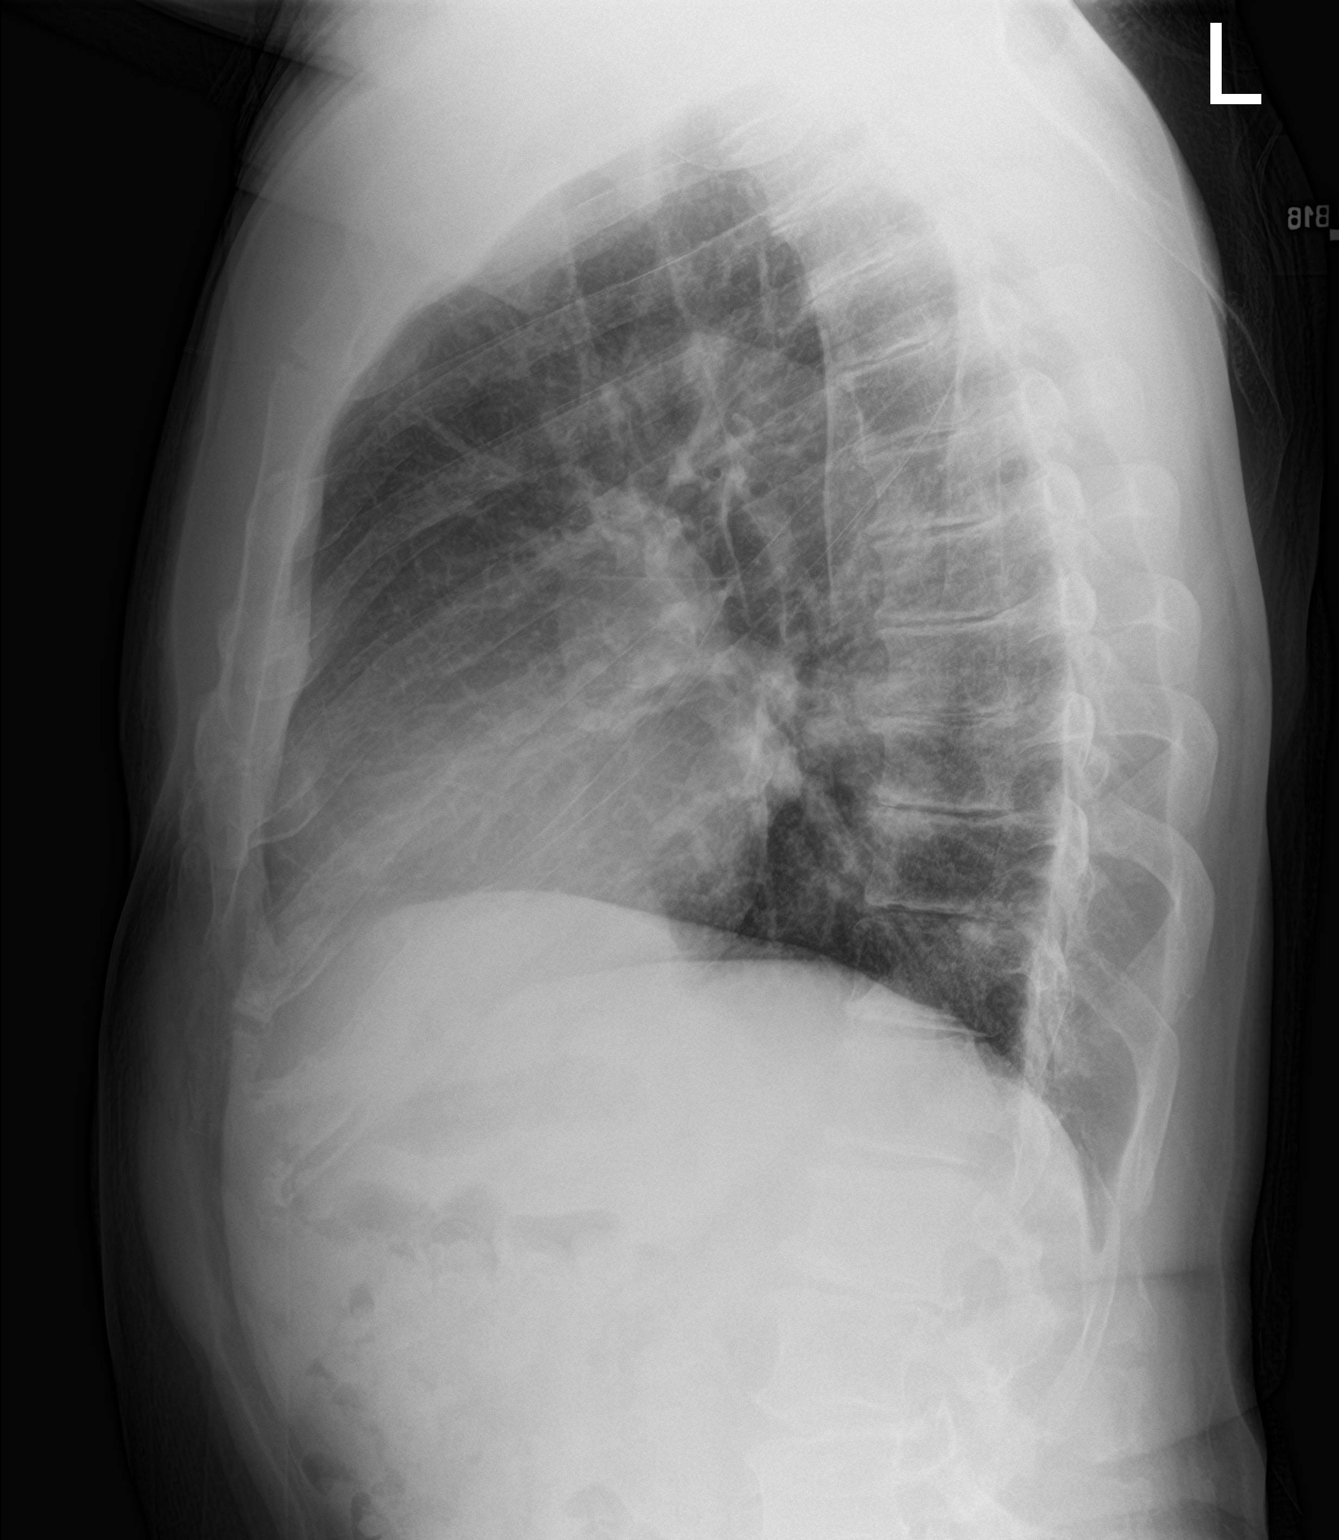

[2 of 2 positions shown; findings below may reference images not displayed]

FINDINGS: The heart size and mediastinal contours are within normal limits.
Both lungs are clear. The visualized skeletal structures are
unremarkable.
IMPRESSION: No active cardiopulmonary disease.

## 2019-11-03 ENCOUNTER — Ambulatory Visit (INDEPENDENT_AMBULATORY_CARE_PROVIDER_SITE_OTHER): Payer: BC Managed Care – PPO | Admitting: Adult Health Nurse Practitioner

## 2019-11-03 ENCOUNTER — Encounter: Payer: Self-pay | Admitting: Adult Health Nurse Practitioner

## 2019-11-03 ENCOUNTER — Other Ambulatory Visit: Payer: Self-pay

## 2019-11-03 VITALS — BP 124/76 | HR 73 | Temp 97.0°F | Wt 209.0 lb

## 2019-11-03 DIAGNOSIS — E559 Vitamin D deficiency, unspecified: Secondary | ICD-10-CM | POA: Diagnosis not present

## 2019-11-03 DIAGNOSIS — R030 Elevated blood-pressure reading, without diagnosis of hypertension: Secondary | ICD-10-CM | POA: Diagnosis not present

## 2019-11-03 DIAGNOSIS — K5730 Diverticulosis of large intestine without perforation or abscess without bleeding: Secondary | ICD-10-CM

## 2019-11-03 DIAGNOSIS — Z79899 Other long term (current) drug therapy: Secondary | ICD-10-CM

## 2019-11-03 DIAGNOSIS — N529 Male erectile dysfunction, unspecified: Secondary | ICD-10-CM

## 2019-11-03 DIAGNOSIS — R7309 Other abnormal glucose: Secondary | ICD-10-CM

## 2019-11-03 DIAGNOSIS — Z6826 Body mass index (BMI) 26.0-26.9, adult: Secondary | ICD-10-CM

## 2019-11-03 DIAGNOSIS — E782 Mixed hyperlipidemia: Secondary | ICD-10-CM | POA: Diagnosis not present
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
## 2019-11-04 LAB — CBC WITH DIFFERENTIAL/PLATELET
Absolute Monocytes: 271 cells/uL (ref 200–950)
Basophils Absolute: 18 cells/uL (ref 0–200)
Basophils Relative: 0.4 %
Eosinophils Absolute: 193 cells/uL (ref 15–500)
Eosinophils Relative: 4.2 %
HCT: 42.4 % (ref 38.5–50.0)
Hemoglobin: 14 g/dL (ref 13.2–17.1)
Lymphs Abs: 1467 cells/uL (ref 850–3900)
MCH: 27.5 pg (ref 27.0–33.0)
MCHC: 33 g/dL (ref 32.0–36.0)
MCV: 83.1 fL (ref 80.0–100.0)
MPV: 9.4 fL (ref 7.5–12.5)
Monocytes Relative: 5.9 %
Neutro Abs: 2650 cells/uL (ref 1500–7800)
Neutrophils Relative %: 57.6 %
Platelets: 293 10*3/uL (ref 140–400)
RBC: 5.1 10*6/uL (ref 4.20–5.80)
RDW: 13.1 % (ref 11.0–15.0)
Total Lymphocyte: 31.9 %
WBC: 4.6 10*3/uL (ref 3.8–10.8)

## 2019-11-04 LAB — LIPID PANEL
Cholesterol: 233 mg/dL — ABNORMAL HIGH (ref <200)
HDL: 55 mg/dL (ref >=40)
LDL Cholesterol (Calc): 159 mg/dL (calc) — ABNORMAL HIGH
Non-HDL Cholesterol (Calc): 178 mg/dL (calc) — ABNORMAL HIGH (ref <130)
Total CHOL/HDL Ratio: 4.2 (calc) (ref <5.0)
Triglycerides: 85 mg/dL (ref <150)

## 2019-11-04 LAB — COMPLETE METABOLIC PANEL WITH GFR
AG Ratio: 1.6 (calc) (ref 1.0–2.5)
ALT: 15 U/L (ref 9–46)
AST: 15 U/L (ref 10–35)
Albumin: 4.4 g/dL (ref 3.6–5.1)
Alkaline phosphatase (APISO): 64 U/L (ref 35–144)
BUN: 10 mg/dL (ref 7–25)
CO2: 30 mmol/L (ref 20–32)
Calcium: 9.9 mg/dL (ref 8.6–10.3)
Chloride: 101 mmol/L (ref 98–110)
Creat: 1.02 mg/dL (ref 0.70–1.25)
GFR, Est African American: 90 mL/min/{1.73_m2} (ref >=60)
GFR, Est Non African American: 78 mL/min/{1.73_m2} (ref >=60)
Globulin: 2.8 g/dL (calc) (ref 1.9–3.7)
Glucose, Bld: 79 mg/dL (ref 65–99)
Potassium: 4.2 mmol/L (ref 3.5–5.3)
Sodium: 138 mmol/L (ref 135–146)
Total Bilirubin: 0.4 mg/dL (ref 0.2–1.2)
Total Protein: 7.2 g/dL (ref 6.1–8.1)

## 2019-11-04 LAB — HEMOGLOBIN A1C
Hgb A1c MFr Bld: 5.5 % of total Hgb (ref <5.7)
Mean Plasma Glucose: 111 (calc)
eAG (mmol/L): 6.2 (calc)

## 2019-11-04 LAB — MAGNESIUM: Magnesium: 1.9 mg/dL (ref 1.5–2.5)

## 2019-11-04 LAB — TSH: TSH: 1.15 mIU/L (ref 0.40–4.50)

## 2019-11-04 LAB — VITAMIN D 25 HYDROXY (VIT D DEFICIENCY, FRACTURES): Vit D, 25-Hydroxy: 39 ng/mL (ref 30–100)

## 2019-11-09 ENCOUNTER — Encounter: Payer: Self-pay | Admitting: Adult Health Nurse Practitioner

## 2019-11-16 ENCOUNTER — Telehealth: Payer: Self-pay

## 2019-11-16 NOTE — Telephone Encounter (Signed)
Pt has been made aware of lab results & will call back if he is out of the CRESTOR.

## 2020-02-01 ENCOUNTER — Encounter: Payer: Self-pay | Admitting: Internal Medicine

## 2020-02-01 NOTE — Progress Notes (Addendum)
NO SHOW

## 2020-02-02 ENCOUNTER — Ambulatory Visit: Payer: BC Managed Care – PPO | Admitting: Internal Medicine

## 2020-04-22 ENCOUNTER — Encounter: Payer: Self-pay | Admitting: Internal Medicine
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
## 2020-04-22 NOTE — Patient Instructions (Signed)

## 2020-04-23 ENCOUNTER — Ambulatory Visit (INDEPENDENT_AMBULATORY_CARE_PROVIDER_SITE_OTHER): Payer: BC Managed Care – PPO | Admitting: Internal Medicine

## 2020-04-23 ENCOUNTER — Other Ambulatory Visit: Payer: Self-pay

## 2020-04-23 VITALS — BP 124/88 | HR 73 | Temp 97.2°F | Resp 16 | Ht 72.0 in | Wt 202.2 lb

## 2020-04-23 DIAGNOSIS — N401 Enlarged prostate with lower urinary tract symptoms: Secondary | ICD-10-CM

## 2020-04-23 DIAGNOSIS — Z0000 Encounter for general adult medical examination without abnormal findings: Secondary | ICD-10-CM

## 2020-04-23 DIAGNOSIS — Z79899 Other long term (current) drug therapy: Secondary | ICD-10-CM

## 2020-04-23 DIAGNOSIS — Z0001 Encounter for general adult medical examination with abnormal findings: Secondary | ICD-10-CM

## 2020-04-23 DIAGNOSIS — Z130 Encounter for screening for diseases of the blood and blood-forming organs and certain disorders involving the immune mechanism: Secondary | ICD-10-CM

## 2020-04-23 DIAGNOSIS — R350 Frequency of micturition: Secondary | ICD-10-CM | POA: Diagnosis not present

## 2020-04-23 DIAGNOSIS — Z136 Encounter for screening for cardiovascular disorders: Secondary | ICD-10-CM | POA: Diagnosis not present

## 2020-04-23 DIAGNOSIS — R5383 Other fatigue: Secondary | ICD-10-CM

## 2020-04-23 DIAGNOSIS — Z131 Encounter for screening for diabetes mellitus: Secondary | ICD-10-CM

## 2020-04-23 DIAGNOSIS — R7309 Other abnormal glucose: Secondary | ICD-10-CM

## 2020-04-23 DIAGNOSIS — Z1211 Encounter for screening for malignant neoplasm of colon: Secondary | ICD-10-CM

## 2020-04-23 DIAGNOSIS — Z13220 Encounter for screening for lipoid disorders: Secondary | ICD-10-CM | POA: Diagnosis not present

## 2020-04-23 DIAGNOSIS — Z125 Encounter for screening for malignant neoplasm of prostate: Secondary | ICD-10-CM | POA: Diagnosis not present

## 2020-04-23 DIAGNOSIS — Z1389 Encounter for screening for other disorder: Secondary | ICD-10-CM | POA: Diagnosis not present

## 2020-04-23 DIAGNOSIS — N138 Other obstructive and reflux uropathy: Secondary | ICD-10-CM

## 2020-04-23 DIAGNOSIS — E559 Vitamin D deficiency, unspecified: Secondary | ICD-10-CM

## 2020-04-23 DIAGNOSIS — R030 Elevated blood-pressure reading, without diagnosis of hypertension: Secondary | ICD-10-CM

## 2020-04-23 DIAGNOSIS — Z1329 Encounter for screening for other suspected endocrine disorder: Secondary | ICD-10-CM | POA: Diagnosis not present

## 2020-04-23 DIAGNOSIS — Z8249 Family history of ischemic heart disease and other diseases of the circulatory system: Secondary | ICD-10-CM

## 2020-04-23 DIAGNOSIS — E782 Mixed hyperlipidemia: Secondary | ICD-10-CM

## 2020-04-24 ENCOUNTER — Other Ambulatory Visit: Payer: Self-pay | Admitting: Internal Medicine

## 2020-04-24 DIAGNOSIS — R972 Elevated prostate specific antigen [PSA]: Secondary | ICD-10-CM

## 2020-04-24 LAB — LIPID PANEL
Cholesterol: 211 mg/dL — ABNORMAL HIGH (ref <200)
HDL: 52 mg/dL (ref >=40)
LDL Cholesterol (Calc): 140 mg/dL (calc) — ABNORMAL HIGH
Non-HDL Cholesterol (Calc): 159 mg/dL (calc) — ABNORMAL HIGH (ref <130)
Total CHOL/HDL Ratio: 4.1 (calc) (ref <5.0)
Triglycerides: 87 mg/dL (ref <150)

## 2020-04-24 LAB — CBC WITH DIFFERENTIAL/PLATELET
Absolute Monocytes: 383 cells/uL (ref 200–950)
Basophils Absolute: 32 cells/uL (ref 0–200)
Basophils Relative: 0.7 %
Eosinophils Absolute: 203 cells/uL (ref 15–500)
Eosinophils Relative: 4.5 %
HCT: 42.7 % (ref 38.5–50.0)
Hemoglobin: 14 g/dL (ref 13.2–17.1)
Lymphs Abs: 1449 cells/uL (ref 850–3900)
MCH: 27.6 pg (ref 27.0–33.0)
MCHC: 32.8 g/dL (ref 32.0–36.0)
MCV: 84.2 fL (ref 80.0–100.0)
MPV: 9.7 fL (ref 7.5–12.5)
Monocytes Relative: 8.5 %
Neutro Abs: 2435 cells/uL (ref 1500–7800)
Neutrophils Relative %: 54.1 %
Platelets: 294 10*3/uL (ref 140–400)
RBC: 5.07 10*6/uL (ref 4.20–5.80)
RDW: 13.1 % (ref 11.0–15.0)
Total Lymphocyte: 32.2 %
WBC: 4.5 10*3/uL (ref 3.8–10.8)

## 2020-04-24 LAB — COMPLETE METABOLIC PANEL WITH GFR
AG Ratio: 1.4 (calc) (ref 1.0–2.5)
ALT: 15 U/L (ref 9–46)
AST: 17 U/L (ref 10–35)
Albumin: 4.3 g/dL (ref 3.6–5.1)
Alkaline phosphatase (APISO): 69 U/L (ref 35–144)
BUN: 15 mg/dL (ref 7–25)
CO2: 28 mmol/L (ref 20–32)
Calcium: 9.9 mg/dL (ref 8.6–10.3)
Chloride: 101 mmol/L (ref 98–110)
Creat: 1.17 mg/dL (ref 0.70–1.25)
GFR, Est African American: 76 mL/min/{1.73_m2} (ref >=60)
GFR, Est Non African American: 65 mL/min/{1.73_m2} (ref >=60)
Globulin: 3.1 g/dL (calc) (ref 1.9–3.7)
Glucose, Bld: 85 mg/dL (ref 65–99)
Potassium: 4.7 mmol/L (ref 3.5–5.3)
Sodium: 138 mmol/L (ref 135–146)
Total Bilirubin: 0.4 mg/dL (ref 0.2–1.2)
Total Protein: 7.4 g/dL (ref 6.1–8.1)

## 2020-04-24 LAB — URINALYSIS, ROUTINE W REFLEX MICROSCOPIC
Bilirubin Urine: NEGATIVE
Glucose, UA: NEGATIVE
Hgb urine dipstick: NEGATIVE
Leukocytes,Ua: NEGATIVE
Nitrite: NEGATIVE
Protein, ur: NEGATIVE
Specific Gravity, Urine: 1.025 (ref 1.001–1.03)
pH: 6 (ref 5.0–8.0)

## 2020-04-24 LAB — VITAMIN D 25 HYDROXY (VIT D DEFICIENCY, FRACTURES): Vit D, 25-Hydroxy: 40 ng/mL (ref 30–100)

## 2020-04-24 LAB — MICROALBUMIN / CREATININE URINE RATIO
Creatinine, Urine: 216 mg/dL (ref 20–320)
Microalb Creat Ratio: 7 mcg/mg creat (ref <30)
Microalb, Ur: 1.5 mg/dL

## 2020-04-24 LAB — HEMOGLOBIN A1C
Hgb A1c MFr Bld: 5.6 % of total Hgb (ref <5.7)
Mean Plasma Glucose: 114 (calc)
eAG (mmol/L): 6.3 (calc)

## 2020-04-24 LAB — TESTOSTERONE: Testosterone: 451 ng/dL (ref 250–827)

## 2020-04-24 LAB — PSA: PSA: 4.86 ng/mL — ABNORMAL HIGH (ref <=4.0)

## 2020-04-24 LAB — VITAMIN B12: Vitamin B-12: 491 pg/mL (ref 200–1100)

## 2020-04-24 LAB — IRON, TOTAL/TOTAL IRON BINDING CAP
%SAT: 18 % (calc) — ABNORMAL LOW (ref 20–48)
Iron: 57 ug/dL (ref 50–180)
TIBC: 324 mcg/dL (calc) (ref 250–425)

## 2020-04-24 LAB — TSH: TSH: 0.93 mIU/L (ref 0.40–4.50)

## 2020-04-24 LAB — INSULIN, RANDOM: Insulin: 11.8 u[IU]/mL

## 2020-04-24 LAB — MAGNESIUM: Magnesium: 1.9 mg/dL (ref 1.5–2.5)

## 2020-04-24 MED ORDER — ROSUVASTATIN CALCIUM 10 MG PO TABS: ORAL_TABLET | ORAL | 1 refills | Status: DC

## 2020-04-24 NOTE — Progress Notes (Signed)
========================================================== ==========================================================  -    Iron level is slightly low, Suggest take an OTC iron supplement  - Also,   Eat more  Veggies with Iron as Carrots, Beets,   all leafy green veggies as Spinach, Collards,  Turnip   - Mustard or Mixed Greens, Kale, Asparagus, Broccoli,   Brussel Sprouts, Green Beans / peas, Soybeans, Lentils, Sweet Potatoes ==========================================================  -  Vitamin B12 - is Normal &  OK  ==========================================================  -  PSA is a little elevated - will check another test called the Free & Total PSA    (May need to re draw blood)  ==========================================================  -  Total Chol = 211 - too high  (Ideal or Gal is less than 180)  - and   - LDL Chol = 14 - very high  (Ideal or Goal is less than 70)   - So. . . . . . . . . .  Stop the Red Yeast Rice Extract   & sent a new Rx to [facility] for low dose Rosuvastatin   ==========================================================  -  A1c - Normal - Great - No Diabetes ==========================================================  -  Vitamin D = 40 - Very Low   (Ideal or Goal is between 70-100)   - So . . . . . Recommend Increase your Vit D 5,000 u caps up to   2 caps = 10,000 units/day ==========================================================  -  All Else - CBC - Kidneys - Electrolytes - Liver - Magnesium & Thyroid    - all  Normal / OK ==========================================================

## 2020-04-27 ENCOUNTER — Other Ambulatory Visit: Payer: Self-pay

## 2020-04-27 ENCOUNTER — Other Ambulatory Visit: Payer: BC Managed Care – PPO

## 2020-04-27 DIAGNOSIS — R972 Elevated prostate specific antigen [PSA]: Secondary | ICD-10-CM

## 2020-04-30 ENCOUNTER — Other Ambulatory Visit: Payer: Self-pay | Admitting: Internal Medicine

## 2020-04-30 DIAGNOSIS — R972 Elevated prostate specific antigen [PSA]: Secondary | ICD-10-CM

## 2020-04-30 LAB — PSA, TOTAL AND FREE
PSA, % Free: 10 % (calc) — ABNORMAL LOW (ref >25)
PSA, Free: 0.5 ng/mL
PSA, Total: 5 ng/mL — ABNORMAL HIGH (ref <=4.0)

## 2020-04-30 NOTE — Progress Notes (Signed)
=================================================== ===================================================  -   PSA is still elevated  - actually a little higher than a week ago  - So put in a referral to see a urologist for consultation  =================================================== ===================================================

## 2020-07-20 ENCOUNTER — Other Ambulatory Visit: Payer: Self-pay

## 2020-07-31 ENCOUNTER — Ambulatory Visit (INDEPENDENT_AMBULATORY_CARE_PROVIDER_SITE_OTHER): Payer: BC Managed Care – PPO | Admitting: Adult Health

## 2020-07-31 ENCOUNTER — Other Ambulatory Visit: Payer: Self-pay

## 2020-07-31 ENCOUNTER — Encounter: Payer: Self-pay | Admitting: Adult Health

## 2020-07-31 ENCOUNTER — Ambulatory Visit: Payer: BC Managed Care – PPO | Admitting: Adult Health

## 2020-07-31 VITALS — BP 140/78 | HR 75 | Temp 97.3°F | Wt 206.4 lb

## 2020-07-31 DIAGNOSIS — E782 Mixed hyperlipidemia: Secondary | ICD-10-CM

## 2020-07-31 DIAGNOSIS — Z79899 Other long term (current) drug therapy: Secondary | ICD-10-CM | POA: Diagnosis not present

## 2020-07-31 DIAGNOSIS — I10 Essential (primary) hypertension: Secondary | ICD-10-CM | POA: Diagnosis not present

## 2020-07-31 DIAGNOSIS — Z6827 Body mass index (BMI) 27.0-27.9, adult: Secondary | ICD-10-CM

## 2020-07-31 DIAGNOSIS — R972 Elevated prostate specific antigen [PSA]: Secondary | ICD-10-CM

## 2020-07-31 DIAGNOSIS — N529 Male erectile dysfunction, unspecified: Secondary | ICD-10-CM

## 2020-07-31 DIAGNOSIS — E559 Vitamin D deficiency, unspecified: Secondary | ICD-10-CM

## 2020-07-31 DIAGNOSIS — R7309 Other abnormal glucose: Secondary | ICD-10-CM

## 2020-07-31 MED ORDER — ROSUVASTATIN CALCIUM 5 MG PO TABS
ORAL_TABLET | ORAL | 3 refills | Status: DC
Start: 2020-07-31 — End: 2020-10-29

## 2020-07-31 NOTE — Patient Instructions (Signed)
Goals    . LDL CALC < 130       Recommend reaching labels and cutting down on: saturated and trans fats  Could try adding a daily red yeast rice supplement (natural herb that statin cholesterol medications were developed from - watch for muscle aches and stop if this happens)  Could also add daily soluble fiber supplement such as citrucel or benefiber (load up on oatmeal, black beans, chia and flax seeds)  - this binds cholesterol in your gut and reduces absorption       High-Fiber Eating Plan Fiber, also called dietary fiber, is a type of carbohydrate. It is found foods such as fruits, vegetables, whole grains, and beans. A high-fiber diet can have many health benefits. Your health care provider may recommend a high-fiber diet to help:  Prevent constipation. Fiber can make your bowel movements more regular.  Lower your cholesterol.  Relieve the following conditions: ? Inflammation of veins in the anus (hemorrhoids). ? Inflammation of specific areas of the digestive tract (uncomplicated diverticulosis). ? A problem of the large intestine, also called the colon, that sometimes causes pain and diarrhea (irritable bowel syndrome, or IBS).  Prevent overeating as part of a weight-loss plan.  Prevent heart disease, type 2 diabetes, and certain cancers. What are tips for following this plan? Reading food labels  Check the nutrition facts label on food products for the amount of dietary fiber. Choose foods that have 5 grams of fiber or more per serving.  The goals for recommended daily fiber intake include: ? Men (age 44 or younger): 34-38 g. ? Men (over age 36): 28-34 g. ? Women (age 87 or younger): 25-28 g. ? Women (over age 23): 22-25 g. Your daily fiber goal is _____________ g.   Shopping  Choose whole fruits and vegetables instead of processed forms, such as apple juice or applesauce.  Choose a wide variety of high-fiber foods such as avocados, lentils, oats, and kidney  beans.  Read the nutrition facts label of the foods you choose. Be aware of foods with added fiber. These foods often have high sugar and sodium amounts per serving. Cooking  Use whole-grain flour for baking and cooking.  Cook with brown rice instead of white rice. Meal planning  Start the day with a breakfast that is high in fiber, such as a cereal that contains 5 g of fiber or more per serving.  Eat breads and cereals that are made with whole-grain flour instead of refined flour or white flour.  Eat brown rice, bulgur wheat, or millet instead of white rice.  Use beans in place of meat in soups, salads, and pasta dishes.  Be sure that half of the grains you eat each day are whole grains. General information  You can get the recommended daily intake of dietary fiber by: ? Eating a variety of fruits, vegetables, grains, nuts, and beans. ? Taking a fiber supplement if you are not able to take in enough fiber in your diet. It is better to get fiber through food than from a supplement.  Gradually increase how much fiber you consume. If you increase your intake of dietary fiber too quickly, you may have bloating, cramping, or gas.  Drink plenty of water to help you digest fiber.  Choose high-fiber snacks, such as berries, raw vegetables, nuts, and popcorn. What foods should I eat? Fruits Berries. Pears. Apples. Oranges. Avocado. Prunes and raisins. Dried figs. Vegetables Sweet potatoes. Spinach. Kale. Artichokes. Cabbage. Broccoli. Cauliflower. Green peas.  Carrots. Squash. Grains Whole-grain breads. Multigrain cereal. Oats and oatmeal. Brown rice. Barley. Bulgur wheat. Millet. Quinoa. Bran muffins. Popcorn. Rye wafer crackers. Meats and other proteins Navy beans, kidney beans, and pinto beans. Soybeans. Split peas. Lentils. Nuts and seeds. Dairy Fiber-fortified yogurt. Beverages Fiber-fortified soy milk. Fiber-fortified orange juice. Other foods Fiber bars. The items listed  above may not be a complete list of recommended foods and beverages. Contact a dietitian for more information. What foods should I avoid? Fruits Fruit juice. Cooked, strained fruit. Vegetables Fried potatoes. Canned vegetables. Well-cooked vegetables. Grains White bread. Pasta made with refined flour. White rice. Meats and other proteins Fatty cuts of meat. Fried chicken or fried fish. Dairy Milk. Yogurt. Cream cheese. Sour cream. Fats and oils Butters. Beverages Soft drinks. Other foods Cakes and pastries. The items listed above may not be a complete list of foods and beverages to avoid. Talk with your dietitian about what choices are best for you. Summary  Fiber is a type of carbohydrate. It is found in foods such as fruits, vegetables, whole grains, and beans.  A high-fiber diet has many benefits. It can help to prevent constipation, lower blood cholesterol, aid weight loss, and reduce your risk of heart disease, diabetes, and certain cancers.  Increase your intake of fiber gradually. Increasing fiber too quickly may cause cramping, bloating, and gas. Drink plenty of water while you increase the amount of fiber you consume.  The best sources of fiber include whole fruits and vegetables, whole grains, nuts, seeds, and beans. This information is not intended to replace advice given to you by your health care provider. Make sure you discuss any questions you have with your health care provider. Document Revised: 10/27/2019 Document Reviewed: 10/27/2019 Elsevier Patient Education  2021 [employer].
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
## 2020-08-01 LAB — LIPID PANEL
Cholesterol: 181 mg/dL (ref <200)
HDL: 51 mg/dL (ref >=40)
LDL Cholesterol (Calc): 113 mg/dL (calc) — ABNORMAL HIGH
Non-HDL Cholesterol (Calc): 130 mg/dL (calc) — ABNORMAL HIGH (ref <130)
Total CHOL/HDL Ratio: 3.5 (calc) (ref <5.0)
Triglycerides: 78 mg/dL (ref <150)

## 2020-08-01 LAB — COMPLETE METABOLIC PANEL WITH GFR
AG Ratio: 1.4 (calc) (ref 1.0–2.5)
ALT: 17 U/L (ref 9–46)
AST: 20 U/L (ref 10–35)
Albumin: 4.2 g/dL (ref 3.6–5.1)
Alkaline phosphatase (APISO): 75 U/L (ref 35–144)
BUN: 12 mg/dL (ref 7–25)
CO2: 27 mmol/L (ref 20–32)
Calcium: 9.7 mg/dL (ref 8.6–10.3)
Chloride: 103 mmol/L (ref 98–110)
Creat: 1.07 mg/dL (ref 0.70–1.25)
GFR, Est African American: 85 mL/min/{1.73_m2} (ref >=60)
GFR, Est Non African American: 73 mL/min/{1.73_m2} (ref >=60)
Globulin: 2.9 g/dL (calc) (ref 1.9–3.7)
Glucose, Bld: 97 mg/dL (ref 65–99)
Potassium: 4 mmol/L (ref 3.5–5.3)
Sodium: 138 mmol/L (ref 135–146)
Total Bilirubin: 0.4 mg/dL (ref 0.2–1.2)
Total Protein: 7.1 g/dL (ref 6.1–8.1)

## 2020-08-01 LAB — CBC WITH DIFFERENTIAL/PLATELET
Absolute Monocytes: 398 cells/uL (ref 200–950)
Basophils Absolute: 29 cells/uL (ref 0–200)
Basophils Relative: 0.6 %
Eosinophils Absolute: 149 cells/uL (ref 15–500)
Eosinophils Relative: 3.1 %
HCT: 40.7 % (ref 38.5–50.0)
Hemoglobin: 13.7 g/dL (ref 13.2–17.1)
Lymphs Abs: 1690 cells/uL (ref 850–3900)
MCH: 28.1 pg (ref 27.0–33.0)
MCHC: 33.7 g/dL (ref 32.0–36.0)
MCV: 83.4 fL (ref 80.0–100.0)
MPV: 9.8 fL (ref 7.5–12.5)
Monocytes Relative: 8.3 %
Neutro Abs: 2534 cells/uL (ref 1500–7800)
Neutrophils Relative %: 52.8 %
Platelets: 347 10*3/uL (ref 140–400)
RBC: 4.88 10*6/uL (ref 4.20–5.80)
RDW: 12.8 % (ref 11.0–15.0)
Total Lymphocyte: 35.2 %
WBC: 4.8 10*3/uL (ref 3.8–10.8)

## 2020-08-01 LAB — TSH: TSH: 1.22 mIU/L (ref 0.40–4.50)

## 2020-10-22 ENCOUNTER — Ambulatory Visit (INDEPENDENT_AMBULATORY_CARE_PROVIDER_SITE_OTHER): Payer: BC Managed Care – PPO | Admitting: Podiatrist

## 2020-10-22 DIAGNOSIS — Z5329 Procedure and treatment not carried out because of patient's decision for other reasons: Secondary | ICD-10-CM

## 2020-10-22 NOTE — Progress Notes (Signed)
No show for today's appointment 

## 2020-10-28 ENCOUNTER — Encounter: Payer: Self-pay | Admitting: Internal Medicine
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
## 2020-10-28 NOTE — Patient Instructions (Signed)

## 2020-10-29 ENCOUNTER — Other Ambulatory Visit: Payer: Self-pay

## 2020-10-29 ENCOUNTER — Ambulatory Visit (INDEPENDENT_AMBULATORY_CARE_PROVIDER_SITE_OTHER): Payer: BC Managed Care – PPO | Admitting: Internal Medicine

## 2020-10-29 VITALS — BP 110/72 | HR 71 | Temp 97.9°F | Resp 16 | Ht 72.0 in | Wt 206.0 lb

## 2020-10-29 DIAGNOSIS — E559 Vitamin D deficiency, unspecified: Secondary | ICD-10-CM | POA: Diagnosis not present

## 2020-10-29 DIAGNOSIS — R0989 Other specified symptoms and signs involving the circulatory and respiratory systems: Secondary | ICD-10-CM | POA: Diagnosis not present

## 2020-10-29 DIAGNOSIS — Z79899 Other long term (current) drug therapy: Secondary | ICD-10-CM

## 2020-10-29 DIAGNOSIS — R7309 Other abnormal glucose: Secondary | ICD-10-CM

## 2020-10-29 DIAGNOSIS — E782 Mixed hyperlipidemia: Secondary | ICD-10-CM | POA: Diagnosis not present

## 2020-10-29 MED ORDER — ROSUVASTATIN CALCIUM 5 MG PO TABS: ORAL_TABLET | ORAL | 1 refills | Status: DC

## 2020-10-30 LAB — LIPID PANEL
Cholesterol: 237 mg/dL — ABNORMAL HIGH (ref <200)
HDL: 51 mg/dL (ref >=40)
LDL Cholesterol (Calc): 163 mg/dL (calc) — ABNORMAL HIGH
Non-HDL Cholesterol (Calc): 186 mg/dL (calc) — ABNORMAL HIGH (ref <130)
Total CHOL/HDL Ratio: 4.6 (calc) (ref <5.0)
Triglycerides: 110 mg/dL (ref <150)

## 2020-10-30 LAB — CBC WITH DIFFERENTIAL/PLATELET
Absolute Monocytes: 405 cells/uL (ref 200–950)
Basophils Absolute: 32 cells/uL (ref 0–200)
Basophils Relative: 0.7 %
Eosinophils Absolute: 129 cells/uL (ref 15–500)
Eosinophils Relative: 2.8 %
HCT: 43.4 % (ref 38.5–50.0)
Hemoglobin: 13.9 g/dL (ref 13.2–17.1)
Lymphs Abs: 1431 cells/uL (ref 850–3900)
MCH: 26.9 pg — ABNORMAL LOW (ref 27.0–33.0)
MCHC: 32 g/dL (ref 32.0–36.0)
MCV: 83.9 fL (ref 80.0–100.0)
MPV: 9.3 fL (ref 7.5–12.5)
Monocytes Relative: 8.8 %
Neutro Abs: 2604 cells/uL (ref 1500–7800)
Neutrophils Relative %: 56.6 %
Platelets: 327 10*3/uL (ref 140–400)
RBC: 5.17 10*6/uL (ref 4.20–5.80)
RDW: 13.1 % (ref 11.0–15.0)
Total Lymphocyte: 31.1 %
WBC: 4.6 10*3/uL (ref 3.8–10.8)

## 2020-10-30 LAB — COMPLETE METABOLIC PANEL WITH GFR
AG Ratio: 1.4 (calc) (ref 1.0–2.5)
ALT: 13 U/L (ref 9–46)
AST: 17 U/L (ref 10–35)
Albumin: 4.2 g/dL (ref 3.6–5.1)
Alkaline phosphatase (APISO): 68 U/L (ref 35–144)
BUN: 11 mg/dL (ref 7–25)
CO2: 29 mmol/L (ref 20–32)
Calcium: 9.9 mg/dL (ref 8.6–10.3)
Chloride: 103 mmol/L (ref 98–110)
Creat: 1.13 mg/dL (ref 0.70–1.25)
GFR, Est African American: 79 mL/min/{1.73_m2} (ref >=60)
GFR, Est Non African American: 68 mL/min/{1.73_m2} (ref >=60)
Globulin: 3 g/dL (calc) (ref 1.9–3.7)
Glucose, Bld: 89 mg/dL (ref 65–99)
Potassium: 4.2 mmol/L (ref 3.5–5.3)
Sodium: 138 mmol/L (ref 135–146)
Total Bilirubin: 0.5 mg/dL (ref 0.2–1.2)
Total Protein: 7.2 g/dL (ref 6.1–8.1)

## 2020-10-30 LAB — TSH: TSH: 0.93 mIU/L (ref 0.40–4.50)

## 2020-10-30 LAB — HEMOGLOBIN A1C
Hgb A1c MFr Bld: 5.7 % of total Hgb — ABNORMAL HIGH (ref <5.7)
Mean Plasma Glucose: 117 mg/dL
eAG (mmol/L): 6.5 mmol/L

## 2020-10-30 LAB — MAGNESIUM: Magnesium: 2 mg/dL (ref 1.5–2.5)

## 2020-10-30 LAB — VITAMIN D 25 HYDROXY (VIT D DEFICIENCY, FRACTURES): Vit D, 25-Hydroxy: 43 ng/mL (ref 30–100)

## 2020-10-30 LAB — INSULIN, RANDOM: Insulin: 14.6 u[IU]/mL

## 2020-10-30 NOTE — Progress Notes (Signed)
============================================================ ============================================================  -  Total Chol = 237 - Elevated  - High risk for Heart  Attack ,                                                                             Stroke & Vascular Dementia            (   Ideal or Goal is less than 180  !   )   - and   - Bad / Dangerous LDL Chol = 163 - Very high Risk for Heart Attack,                                                                               Stroke & Vascular Dementia             (    Ideal or Goal is less than 70  !  )  - Sent in new Rx for the Low dose Crestor and recommend                                                    take every other day  - even days of month                                                                                              (2  4   6   8  10    etc) ============================================================ ============================================================  -  A1c = 5.7% . Blood sugar and A1c is  elevated in the borderline and                                                  early or pre-diabetes range which has the same   300% increased risk for heart attack, stroke, cancer and   Alzheimer- type Vascular Dementia as full blown Diabetes.   But the good news is that diet, exercise with                                   weight loss can cure the early diabetes at this point. ============================================================ ============================================================  -  Vitamin D = 43 -  very low   - Vitamin D goal is between 70-100.   - Recc increase your Vit D 5,000 unit caps to                                                       Take 2 capsules = 10,000 units every day   - It is very important as a natural anti-inflammatory and helping the  immune system protect against viral infections, like the Covid-19    helping hair, skin, and nails, as  well as reducing stroke and                                                                                     heart attack risk.   - It helps your bones and helps with mood.  - It also decreases numerous cancer risks so please  take it as directed.   - Low Vit D is associated with a 200-300% higher risk for  CANCER   and 200-300% higher risk for HEART   ATTACK  &  STROKE.    - It is also associated with higher death rate at younger ages,   autoimmune diseases like Rheumatoid arthritis, Lupus,  Multiple Sclerosis.     - Also many other serious conditions, like depression, Alzheimer's  Dementia, infertility, muscle aches, fatigue, fibromyalgia   - just to name a few. ============================================================ ============================================================  - All Else - CBC - Kidneys - Electrolytes - Liver - Magnesium & Thyroid    - all  Normal / OK  ============================================================ ============================================================

## 2020-11-02 ENCOUNTER — Encounter: Payer: Self-pay | Admitting: Podiatrist

## 2020-11-02 ENCOUNTER — Other Ambulatory Visit: Payer: Self-pay

## 2020-11-02 ENCOUNTER — Ambulatory Visit: Payer: BC Managed Care – PPO | Admitting: Podiatrist

## 2020-11-02 ENCOUNTER — Ambulatory Visit: Payer: BC Managed Care – PPO

## 2020-11-02 ENCOUNTER — Other Ambulatory Visit: Payer: Self-pay | Admitting: Podiatrist

## 2020-11-02 ENCOUNTER — Ambulatory Visit (INDEPENDENT_AMBULATORY_CARE_PROVIDER_SITE_OTHER): Payer: BC Managed Care – PPO

## 2020-11-02 DIAGNOSIS — M7752 Other enthesopathy of left foot: Secondary | ICD-10-CM

## 2020-11-02 DIAGNOSIS — M79672 Pain in left foot: Secondary | ICD-10-CM

## 2020-11-02 DIAGNOSIS — L84 Corns and callosities: Secondary | ICD-10-CM

## 2020-11-02 NOTE — Progress Notes (Signed)
Chief Complaint  Patient presents with  . Foot Problem    Intermittent pain/achiness in lateral side of left for for a few months now, depending on types of shoes he wears. Tx: insoles.     HPI: Patient is 65 y.o. male who presents today for the concerns as listed above.   Patient Active Problem List   Diagnosis Date Noted  . Other abnormal glucose (hx of prediabetes)  07/31/2020  . Erectile dysfunction 04/28/2019  . 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 Elevated PSA 08/05/2017  . Physical exam, annual 04/20/2013  . Diverticulosis of large intestine 03/09/2009  . Hyperlipidemia 03/27/2008  . CHEST PAIN, ATYPICAL 03/27/2008  . Dysphagia 03/27/2008    Current Outpatient Medications on File Prior to Visit  Medication Sig Dispense Refill  . APPLE CIDER VINEGAR PO Take by mouth daily.    Marland Kitchen aspirin 81 MG tablet Take 81 mg by mouth daily.    . Cholecalciferol (VITAMIN D3) 125 MCG (5000 UT) CAPS Take by mouth daily.    . Multiple Vitamin (MULTIVITAMIN) tablet Take 1 tablet by mouth daily.    . rosuvastatin (CRESTOR) 5 MG tablet Take  1 tablet  every other day - Even Days of the Month for Cholesterol 45 tablet 1  . UNABLE TO FIND Med Name: Blackseed oil    . vitamin C (ASCORBIC ACID) 500 MG tablet Take 500 mg by mouth daily.    . Zinc 50 MG TABS Take by mouth daily.     No current facility-administered medications on file prior to visit.    Allergies  Allergen Reactions  . Ppd [Tuberculin Purified Protein Derivative]     Hx/o strong reaction in childhood    Review of Systems No fevers, chills, nausea, muscle aches, no difficulty breathing, no calf pain, no chest pain or shortness of breath.   Physical Exam  GENERAL APPEARANCE: Alert, conversant. Appropriately groomed. No acute distress.   VASCULAR: Pedal pulses palpable DP and PT bilateral.  Capillary refill time is immediate to all digits,  Proximal to distal cooling it warm to warm.  Digital perfusion adequate.    NEUROLOGIC: sensation is intact to 5.07 monofilament at 5/5 sites bilateral.  Light touch is intact bilateral, vibratory sensation intact bilateral  MUSCULOSKELETAL: acceptable muscle strength, tone and stability bilateral.  Inflammation along the base of the fifth metatarsal is noted on the left foot some pain with palpation noted..  Slight callus deformity is also present on the plantar aspect of the area of inflammation..  DERMATOLOGIC: skin is warm, supple, and dry.  No open lesions noted.  No rash, no pre ulcerative lesions. Digital nails are asymptomatic.    X-ray evaluation 3 views of the left foot are obtained.  Changes at the head of the first metatarsal and head of the fifth metatarsal are noted.  He also has soft tissue inflammation along the base of the fifth metatarsal most easily seen on the oblique view.  No acute osseous abnormalities are seen.  Assessment     ICD-10-CM   1. Left foot pain  M79.672 CANCELED: DG Foot 2 Views Left    CANCELED: DG Foot Complete Left  2. Bursitis of left foot  M77.52   3. Callus of foot  L84      Plan  Exam and x-ray findings are discussed with the patient.  I recommended a steroid injection and the patient declined.  I then recommended Voltaren gel and the patient states he would rather use natural products.  I did suggest Biofreeze as an alternative.  I pared the callus down with a #15 blade without complication.  He is also happy with the inserts he found to use in his shoes therefore I recommended continued use of these as well.  If the pain continues or does not improve in the future he will call otherwise he will be seen back as needed.

## 2020-11-02 NOTE — Patient Instructions (Signed)
Try Biofreeze or voltaren gel if the foot starts hurting back up again.  Keep wearing the inserts in your shoes

## 2021-02-05 ENCOUNTER — Encounter: Payer: BC Managed Care – PPO | Admitting: Internal Medicine

## 2021-05-12 DIAGNOSIS — E559 Vitamin D deficiency, unspecified: Secondary | ICD-10-CM | POA: Insufficient documentation

## 2021-05-12 DIAGNOSIS — R0989 Other specified symptoms and signs involving the circulatory and respiratory systems: Secondary | ICD-10-CM | POA: Insufficient documentation
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
## 2021-05-12 NOTE — Patient Instructions (Signed)

## 2021-05-13 ENCOUNTER — Ambulatory Visit (INDEPENDENT_AMBULATORY_CARE_PROVIDER_SITE_OTHER): Payer: BC Managed Care – PPO | Admitting: Internal Medicine

## 2021-05-13 ENCOUNTER — Encounter: Payer: Self-pay | Admitting: Internal Medicine

## 2021-05-13 ENCOUNTER — Other Ambulatory Visit: Payer: Self-pay

## 2021-05-13 VITALS — BP 150/80 | HR 71 | Temp 97.5°F | Resp 16 | Ht 72.0 in | Wt 207.6 lb

## 2021-05-13 DIAGNOSIS — R350 Frequency of micturition: Secondary | ICD-10-CM | POA: Diagnosis not present

## 2021-05-13 DIAGNOSIS — Z136 Encounter for screening for cardiovascular disorders: Secondary | ICD-10-CM

## 2021-05-13 DIAGNOSIS — Z131 Encounter for screening for diabetes mellitus: Secondary | ICD-10-CM

## 2021-05-13 DIAGNOSIS — Z1211 Encounter for screening for malignant neoplasm of colon: Secondary | ICD-10-CM

## 2021-05-13 DIAGNOSIS — Z13220 Encounter for screening for lipoid disorders: Secondary | ICD-10-CM | POA: Diagnosis not present

## 2021-05-13 DIAGNOSIS — Z79899 Other long term (current) drug therapy: Secondary | ICD-10-CM

## 2021-05-13 DIAGNOSIS — Z8249 Family history of ischemic heart disease and other diseases of the circulatory system: Secondary | ICD-10-CM

## 2021-05-13 DIAGNOSIS — Z125 Encounter for screening for malignant neoplasm of prostate: Secondary | ICD-10-CM

## 2021-05-13 DIAGNOSIS — E559 Vitamin D deficiency, unspecified: Secondary | ICD-10-CM

## 2021-05-13 DIAGNOSIS — Z0000 Encounter for general adult medical examination without abnormal findings: Secondary | ICD-10-CM | POA: Diagnosis not present

## 2021-05-13 DIAGNOSIS — Z1389 Encounter for screening for other disorder: Secondary | ICD-10-CM

## 2021-05-13 DIAGNOSIS — Z0001 Encounter for general adult medical examination with abnormal findings: Secondary | ICD-10-CM

## 2021-05-13 DIAGNOSIS — N401 Enlarged prostate with lower urinary tract symptoms: Secondary | ICD-10-CM | POA: Diagnosis not present

## 2021-05-13 DIAGNOSIS — R0989 Other specified symptoms and signs involving the circulatory and respiratory systems: Secondary | ICD-10-CM | POA: Diagnosis not present

## 2021-05-13 DIAGNOSIS — R972 Elevated prostate specific antigen [PSA]: Secondary | ICD-10-CM

## 2021-05-13 DIAGNOSIS — E782 Mixed hyperlipidemia: Secondary | ICD-10-CM

## 2021-05-13 DIAGNOSIS — R7309 Other abnormal glucose: Secondary | ICD-10-CM

## 2021-05-14 LAB — PSA: PSA: 2.62 ng/mL (ref <=4.00)

## 2021-05-14 LAB — COMPLETE METABOLIC PANEL WITH GFR
AG Ratio: 1.4 (calc) (ref 1.0–2.5)
ALT: 15 U/L (ref 9–46)
AST: 17 U/L (ref 10–35)
Albumin: 4.2 g/dL (ref 3.6–5.1)
Alkaline phosphatase (APISO): 70 U/L (ref 35–144)
BUN: 11 mg/dL (ref 7–25)
CO2: 27 mmol/L (ref 20–32)
Calcium: 9.5 mg/dL (ref 8.6–10.3)
Chloride: 105 mmol/L (ref 98–110)
Creat: 1.13 mg/dL (ref 0.70–1.35)
Globulin: 2.9 g/dL (calc) (ref 1.9–3.7)
Glucose, Bld: 84 mg/dL (ref 65–99)
Potassium: 4.1 mmol/L (ref 3.5–5.3)
Sodium: 140 mmol/L (ref 135–146)
Total Bilirubin: 0.5 mg/dL (ref 0.2–1.2)
Total Protein: 7.1 g/dL (ref 6.1–8.1)
eGFR: 72 mL/min/{1.73_m2} (ref >=60)

## 2021-05-14 LAB — CBC WITH DIFFERENTIAL/PLATELET
Absolute Monocytes: 340 cells/uL (ref 200–950)
Basophils Absolute: 32 cells/uL (ref 0–200)
Basophils Relative: 0.7 %
Eosinophils Absolute: 230 cells/uL (ref 15–500)
Eosinophils Relative: 5 %
HCT: 42.2 % (ref 38.5–50.0)
Hemoglobin: 13.3 g/dL (ref 13.2–17.1)
Lymphs Abs: 1619 cells/uL (ref 850–3900)
MCH: 26.8 pg — ABNORMAL LOW (ref 27.0–33.0)
MCHC: 31.5 g/dL — ABNORMAL LOW (ref 32.0–36.0)
MCV: 84.9 fL (ref 80.0–100.0)
MPV: 9.8 fL (ref 7.5–12.5)
Monocytes Relative: 7.4 %
Neutro Abs: 2378 cells/uL (ref 1500–7800)
Neutrophils Relative %: 51.7 %
Platelets: 279 10*3/uL (ref 140–400)
RBC: 4.97 10*6/uL (ref 4.20–5.80)
RDW: 13.1 % (ref 11.0–15.0)
Total Lymphocyte: 35.2 %
WBC: 4.6 10*3/uL (ref 3.8–10.8)

## 2021-05-14 LAB — URINALYSIS, ROUTINE W REFLEX MICROSCOPIC
Bilirubin Urine: NEGATIVE
Glucose, UA: NEGATIVE
Hgb urine dipstick: NEGATIVE
Ketones, ur: NEGATIVE
Leukocytes,Ua: NEGATIVE
Nitrite: NEGATIVE
Protein, ur: NEGATIVE
Specific Gravity, Urine: 1.024 (ref 1.001–1.035)
pH: 6 (ref 5.0–8.0)

## 2021-05-14 LAB — VITAMIN D 25 HYDROXY (VIT D DEFICIENCY, FRACTURES): Vit D, 25-Hydroxy: 49 ng/mL (ref 30–100)

## 2021-05-14 LAB — TSH: TSH: 1.3 mIU/L (ref 0.40–4.50)

## 2021-05-14 LAB — HEMOGLOBIN A1C
Hgb A1c MFr Bld: 5.5 % of total Hgb (ref <5.7)
Mean Plasma Glucose: 111 mg/dL
eAG (mmol/L): 6.2 mmol/L

## 2021-05-14 LAB — MICROALBUMIN / CREATININE URINE RATIO
Creatinine, Urine: 243 mg/dL (ref 20–320)
Microalb Creat Ratio: 5 mcg/mg creat (ref <30)
Microalb, Ur: 1.1 mg/dL

## 2021-05-14 LAB — LIPID PANEL
Cholesterol: 179 mg/dL (ref <200)
HDL: 53 mg/dL (ref >=40)
LDL Cholesterol (Calc): 111 mg/dL (calc) — ABNORMAL HIGH
Non-HDL Cholesterol (Calc): 126 mg/dL (calc) (ref <130)
Total CHOL/HDL Ratio: 3.4 (calc) (ref <5.0)
Triglycerides: 68 mg/dL (ref <150)

## 2021-05-14 LAB — INSULIN, RANDOM: Insulin: 6.3 u[IU]/mL

## 2021-05-14 LAB — MAGNESIUM: Magnesium: 1.9 mg/dL (ref 1.5–2.5)

## 2021-05-14 NOTE — Progress Notes (Signed)
============================================================ - Test results slightly outside the reference range are not unusual. If there is anything important, I will review this with you,  otherwise it is considered normal test values.  If you have further questions,  please do not hesitate to contact me at the office or via My Chart.  ============================================================ ============================================================  -  PSA - Low - Great ! ============================================================ ============================================================  -  Chol = 179 - Great , But      - Bad LDL Chol = 111 - too high   - Cholesterol is too high - Recommend low cholesterol diet   - Cholesterol only comes from animal sources  - ie. meat, dairy, egg yolks  - Eat all the vegetables you want.  - Avoid meat, especially red meat - Beef AND Pork .  - Avoid cheese & dairy - milk & ice cream.     - Cheese is the most concentrated form of trans-fats which  is the worst thing to clog up our arteries.   - Veggie cheese is OK which can be found in the fresh  produce section at [facility] or [employer] or [employer] ============================================================ ============================================================  -   A1c is Normal - Great - No Diabetes                                                                                                                                                                                                                                                                                                                                                                                                                                                                                                                                                                                                                                                                                                                                                                                                                                                                                                                                                                                                                                                                                                                                                  ============================================================ ============================================================  -  Vitamin D = 49 - is a little low - Recommend increase                                     your Vit D 5,000 to 2 capsules =                                                                     10,000 units/day                                                                                                                                                                                                                                                                                                                                                                                                                                                                                                                                                                                                                                                                                                                                                                                                                                                                                                                                                                                                                                                                                                                                                                                                                                                                                                                                                                                                                                                                                                                                                                                                                                                                                                                                                                                                                                                                                                                                                                                                                                                                                                                                                                                                                                                                                                                                                                                                                                                                                                                                                                                                                                                                                                                                                                                           ============================================================ ============================================================  -

## 2021-08-14 ENCOUNTER — Encounter: Payer: Self-pay | Admitting: Internal Medicine

## 2021-10-24 ENCOUNTER — Ambulatory Visit (INDEPENDENT_AMBULATORY_CARE_PROVIDER_SITE_OTHER): Payer: BC Managed Care – PPO | Admitting: Internal Medicine

## 2021-10-24 ENCOUNTER — Encounter: Payer: Self-pay | Admitting: Internal Medicine

## 2021-10-24 VITALS — BP 128/80 | HR 78 | Temp 97.9°F | Resp 16 | Ht 72.0 in | Wt 199.8 lb

## 2021-10-24 DIAGNOSIS — R112 Nausea with vomiting, unspecified: Secondary | ICD-10-CM | POA: Diagnosis not present

## 2021-10-24 MED ORDER — ONDANSETRON HCL 8 MG PO TABS: ORAL_TABLET | ORAL | 1 refills | Status: DC

## 2021-10-26 NOTE — Progress Notes (Signed)
? ? ?  Future Appointments  ?Date Time Provider Department  ?11/12/2021  4:00 PM Revonda Humphrey, NP [facility]  ?05/13/2022 10:00 AM Lucky Cowboy, MD [facility]  ? ? ?History of Present Illness: ? ?   Patient is a very nice 66 yo MBM with labile HTN, HLD, Prediabetes and Vitamin D Deficiency who presents with a 24 hour prodrome of N/V. Denies any heartburn, reflux sx's Blood in emesis. No fever, chills, or rash. Able to take some liquids , but no solids.  ? ? ? ?Medications ? ?  rosuvastatin 5 MG tablet, Take  1 tablet  every other day  ?  aspirin 81 MG tablet, Take  daily. ?  APPLE CIDER VINEGAR , Take =daily. ?  VITAMIN D 125 MCG (5000 UT) CAPS, Take =daily. ?  Multiple Vitamin  tablet, Take 1 tablet  daily. ?  Blackseed oil ?  vitamin C  500 MG tablet, Take daily. ?  Zinc 50 MG TABS, Take  daily. ? ?Problem list ?He has Hyperlipidemia, mixed; Diverticulosis of large intestine; CHEST PAIN, ATYPICAL; Dysphagia; Elevated PSA; History of positive PPD; Erectile dysfunction; Abnormal glucose; Labile hypertension; and Vitamin D deficiency on their problem list. ?  ?Observations/Objective: ? ?BP 128/80   Pulse 78   Temp 97.9 ?F (36.6 ?C)   Resp 16   Ht 6' (1.829 m)   Wt 199 lb 12.8 oz (90.6 kg)   SpO2 97%   BMI 27.10 kg/m?  ? ?HEENT - WNL. ?Neck - supple.  ?Chest - Clear equal BS. ?Cor - Nl HS. RRR w/o sig MGR. PP 1(+). No edema. ?Abd - Soft. Non tender. BS nl.  ?MS- FROM w/o deformities.  Gait Nl. ?Neuro -  Nl w/o focal abnormalities. ? ?Assessment and Plan: ? ? ?1. Nausea and vomiting ? ?- ondansetron (ZOFRAN) 8 MG tablet; Take 1 tablet 3 x / day for Nausea / Vomitting  Dispense: 30 tablet; Refill: 1 ? ? ?Follow Up Instructions: ? ?  ?    I discussed the assessment and treatment plan with the patient. The patient was provided an opportunity to ask questions and all were answered. The patient agreed with the plan and demonstrated an understanding of the instructions. ?  ?    The patient was advised to call back or  seek an in-person evaluation if the symptoms worsen or if the condition fails to improve as anticipated. ? ? ?Marinus Maw, MD ? ?
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
## 2021-11-12 ENCOUNTER — Ambulatory Visit: Payer: BC Managed Care – PPO | Admitting: Nurse Practitioner

## 2021-11-13 ENCOUNTER — Encounter: Payer: Self-pay | Admitting: Nurse Practitioner

## 2021-11-13 ENCOUNTER — Ambulatory Visit: Payer: BC Managed Care – PPO | Admitting: Nurse Practitioner

## 2021-11-13 VITALS — BP 110/68 | HR 76 | Temp 97.9°F | Wt 206.2 lb

## 2021-11-13 DIAGNOSIS — E559 Vitamin D deficiency, unspecified: Secondary | ICD-10-CM

## 2021-11-13 DIAGNOSIS — E782 Mixed hyperlipidemia: Secondary | ICD-10-CM | POA: Diagnosis not present

## 2021-11-13 DIAGNOSIS — Z6827 Body mass index (BMI) 27.0-27.9, adult: Secondary | ICD-10-CM

## 2021-11-13 DIAGNOSIS — R7309 Other abnormal glucose: Secondary | ICD-10-CM | POA: Diagnosis not present

## 2021-11-13 DIAGNOSIS — R0989 Other specified symptoms and signs involving the circulatory and respiratory systems: Secondary | ICD-10-CM | POA: Diagnosis not present

## 2021-11-13 DIAGNOSIS — Z79899 Other long term (current) drug therapy: Secondary | ICD-10-CM

## 2021-11-13 DIAGNOSIS — N529 Male erectile dysfunction, unspecified: Secondary | ICD-10-CM
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
## 2021-11-14 LAB — COMPLETE METABOLIC PANEL WITH GFR
AG Ratio: 1.5 (calc) (ref 1.0–2.5)
ALT: 13 U/L (ref 9–46)
AST: 14 U/L (ref 10–35)
Albumin: 4.1 g/dL (ref 3.6–5.1)
Alkaline phosphatase (APISO): 66 U/L (ref 35–144)
BUN: 13 mg/dL (ref 7–25)
CO2: 29 mmol/L (ref 20–32)
Calcium: 9.8 mg/dL (ref 8.6–10.3)
Chloride: 103 mmol/L (ref 98–110)
Creat: 1.09 mg/dL (ref 0.70–1.35)
Globulin: 2.8 g/dL (calc) (ref 1.9–3.7)
Glucose, Bld: 85 mg/dL (ref 65–99)
Potassium: 4.1 mmol/L (ref 3.5–5.3)
Sodium: 140 mmol/L (ref 135–146)
Total Bilirubin: 0.4 mg/dL (ref 0.2–1.2)
Total Protein: 6.9 g/dL (ref 6.1–8.1)
eGFR: 75 mL/min/{1.73_m2} (ref >=60)

## 2021-11-14 LAB — CBC WITH DIFFERENTIAL/PLATELET
Absolute Monocytes: 319 cells/uL (ref 200–950)
Basophils Absolute: 21 cells/uL (ref 0–200)
Basophils Relative: 0.5 %
Eosinophils Absolute: 202 cells/uL (ref 15–500)
Eosinophils Relative: 4.8 %
HCT: 40.2 % (ref 38.5–50.0)
Hemoglobin: 13.2 g/dL (ref 13.2–17.1)
Lymphs Abs: 1529 cells/uL (ref 850–3900)
MCH: 27.4 pg (ref 27.0–33.0)
MCHC: 32.8 g/dL (ref 32.0–36.0)
MCV: 83.6 fL (ref 80.0–100.0)
MPV: 9.6 fL (ref 7.5–12.5)
Monocytes Relative: 7.6 %
Neutro Abs: 2129 cells/uL (ref 1500–7800)
Neutrophils Relative %: 50.7 %
Platelets: 276 10*3/uL (ref 140–400)
RBC: 4.81 10*6/uL (ref 4.20–5.80)
RDW: 13.5 % (ref 11.0–15.0)
Total Lymphocyte: 36.4 %
WBC: 4.2 10*3/uL (ref 3.8–10.8)

## 2021-11-14 LAB — HEMOGLOBIN A1C
Hgb A1c MFr Bld: 5.7 % of total Hgb — ABNORMAL HIGH (ref <5.7)
Mean Plasma Glucose: 117 mg/dL
eAG (mmol/L): 6.5 mmol/L

## 2021-11-14 LAB — LIPID PANEL
Cholesterol: 206 mg/dL — ABNORMAL HIGH (ref <200)
HDL: 50 mg/dL (ref >=40)
LDL Cholesterol (Calc): 131 mg/dL (calc) — ABNORMAL HIGH
Non-HDL Cholesterol (Calc): 156 mg/dL (calc) — ABNORMAL HIGH (ref <130)
Total CHOL/HDL Ratio: 4.1 (calc) (ref <5.0)
Triglycerides: 135 mg/dL (ref <150)

## 2022-05-12 ENCOUNTER — Encounter: Payer: Self-pay | Admitting: Internal Medicine

## 2022-05-12 NOTE — Patient Instructions (Signed)
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
## 2022-05-13 ENCOUNTER — Encounter: Payer: Self-pay | Admitting: Internal Medicine

## 2022-05-13 ENCOUNTER — Ambulatory Visit (INDEPENDENT_AMBULATORY_CARE_PROVIDER_SITE_OTHER): Payer: BC Managed Care – PPO | Admitting: Internal Medicine

## 2022-05-13 VITALS — BP 138/78 | HR 64 | Temp 97.9°F | Resp 17 | Ht 72.0 in | Wt 206.0 lb

## 2022-05-13 DIAGNOSIS — Z131 Encounter for screening for diabetes mellitus: Secondary | ICD-10-CM

## 2022-05-13 DIAGNOSIS — R350 Frequency of micturition: Secondary | ICD-10-CM

## 2022-05-13 DIAGNOSIS — Z0000 Encounter for general adult medical examination without abnormal findings: Secondary | ICD-10-CM | POA: Diagnosis not present

## 2022-05-13 DIAGNOSIS — E782 Mixed hyperlipidemia: Secondary | ICD-10-CM

## 2022-05-13 DIAGNOSIS — Z79899 Other long term (current) drug therapy: Secondary | ICD-10-CM

## 2022-05-13 DIAGNOSIS — Z13220 Encounter for screening for lipoid disorders: Secondary | ICD-10-CM | POA: Diagnosis not present

## 2022-05-13 DIAGNOSIS — Z8249 Family history of ischemic heart disease and other diseases of the circulatory system: Secondary | ICD-10-CM

## 2022-05-13 DIAGNOSIS — Z1389 Encounter for screening for other disorder: Secondary | ICD-10-CM | POA: Diagnosis not present

## 2022-05-13 DIAGNOSIS — Z1211 Encounter for screening for malignant neoplasm of colon: Secondary | ICD-10-CM

## 2022-05-13 DIAGNOSIS — N401 Enlarged prostate with lower urinary tract symptoms: Secondary | ICD-10-CM | POA: Diagnosis not present

## 2022-05-13 DIAGNOSIS — I10 Essential (primary) hypertension: Secondary | ICD-10-CM

## 2022-05-13 DIAGNOSIS — E559 Vitamin D deficiency, unspecified: Secondary | ICD-10-CM | POA: Diagnosis not present

## 2022-05-13 DIAGNOSIS — Z136 Encounter for screening for cardiovascular disorders: Secondary | ICD-10-CM

## 2022-05-13 DIAGNOSIS — Z125 Encounter for screening for malignant neoplasm of prostate: Secondary | ICD-10-CM | POA: Diagnosis not present

## 2022-05-13 DIAGNOSIS — Z0001 Encounter for general adult medical examination with abnormal findings: Secondary | ICD-10-CM

## 2022-05-13 DIAGNOSIS — R7309 Other abnormal glucose: Secondary | ICD-10-CM

## 2022-05-13 DIAGNOSIS — I700 Atherosclerosis of aorta: Secondary | ICD-10-CM

## 2022-05-13 DIAGNOSIS — R0989 Other specified symptoms and signs involving the circulatory and respiratory systems: Secondary | ICD-10-CM

## 2022-05-14 NOTE — Progress Notes (Signed)
<><><><><><><><><><><><><><><><><><><><><><><><><><><><><><><><><> <><><><><><><><><><><><><><><><><><><><><><><><><><><><><><><><><>  -  Total  Chol =  197    -   slightly Elevated             (  Ideal  or  Goal is less than 180  !  )  & -  Bad / Dangerous LDL  Chol =  97   - also  slightly Elevated              (  Ideal  or  Goal is less than 70  !  )   - So - Recommend  a stricter low cholesterol diet  & if not improve,                                                             consider Re-starting meds,  so don't                                                                                                have a heart attack,                                                                                                                         Stroke                                                                                       or get Alzheimer's Dementia ! ==================================================================  - Cholesterol only comes from animal sources                                                           - ie. meat, dairy, egg yolks  - Eat all the vegetables you want.  - Avoid Meat, Avoid Meat,  Avoid Meat                                            -  especially Red Meat - Beef AND Pork .  - Avoid cheese & dairy - milk & ice cream.     - Cheese is the most concentrated form of trans-fats which                                              is the worst thing to clog up our arteries.   - Veggie cheese is OK which can be found in the fresh  produce section at [employer] or [employer] or [employer]  <><><><><><><><><><><><><><><><><><><><><><><><><><><><><><><><><>  - PSA  - Low  suggests no Prostate cancer  - Great  ! <><><><><><><><><><><><><><><><><><><><><><><><><><><><><><><><><> <><><><><><><><><><><><><><><><><><><><><><><><><><><><><><><><><>   -  Vit D =   34      - Extremely Dangerously Low  !    - Vitamin D goal is between 70-100.    - Please make sure that you are taking your Vitamin D as directed.   - It is very important as a natural anti-inflammatory and helping the                                immune system protect against viral infections, like the Covid-19    helping hair, skin, and nails, as well as reducing stroke and  heart attack risk.   - It helps your bones and helps with mood.  - It also decreases numerous cancer risks so please                                                                                  take it as directed.   - Low Vit D is associated with a 200-300% higher risk for CANCER   and 200-300% higher risk for HEART   ATTACK  &  STROKE.    - It is also associated with higher death rate at younger ages,   autoimmune diseases like Rheumatoid arthritis, Lupus,  Multiple Sclerosis.     - Also many other serious conditions, like depression,                                                                    Alzheimer's Dementia,                                                                                           muscle aches,  fatigue,                                                                                                                 fibromyalgia  <><><><><><><><><><><><><><><><><><><><><><><><><><><><><><><><><> <><><><><><><><><><><><><><><><><><><><><><><><><><><><><><><><><>  -  All Else - CBC - Kidneys - Electrolytes - Liver - Magnesium & Thyroid    - all  Normal / OK <><><><><><><><><><><><><><><><><><><><><><><><><><><><><><><><><> <><><><><><><><><><><><><><><><><><><><><><><><><><><><><><><><><>

## 2022-05-15 LAB — CBC WITH DIFFERENTIAL/PLATELET
Absolute Monocytes: 380 cells/uL (ref 200–950)
Basophils Absolute: 30 cells/uL (ref 0–200)
Basophils Relative: 0.6 %
Eosinophils Absolute: 280 cells/uL (ref 15–500)
Eosinophils Relative: 5.6 %
HCT: 42.1 % (ref 38.5–50.0)
Hemoglobin: 13.7 g/dL (ref 13.2–17.1)
Lymphs Abs: 1565 cells/uL (ref 850–3900)
MCH: 27.8 pg (ref 27.0–33.0)
MCHC: 32.5 g/dL (ref 32.0–36.0)
MCV: 85.6 fL (ref 80.0–100.0)
MPV: 9.5 fL (ref 7.5–12.5)
Monocytes Relative: 7.6 %
Neutro Abs: 2745 cells/uL (ref 1500–7800)
Neutrophils Relative %: 54.9 %
Platelets: 313 10*3/uL (ref 140–400)
RBC: 4.92 10*6/uL (ref 4.20–5.80)
RDW: 13.3 % (ref 11.0–15.0)
Total Lymphocyte: 31.3 %
WBC: 5 10*3/uL (ref 3.8–10.8)

## 2022-05-15 LAB — COMPLETE METABOLIC PANEL WITH GFR
AG Ratio: 1.4 (calc) (ref 1.0–2.5)
ALT: 12 U/L (ref 9–46)
AST: 14 U/L (ref 10–35)
Albumin: 4.3 g/dL (ref 3.6–5.1)
Alkaline phosphatase (APISO): 72 U/L (ref 35–144)
BUN: 10 mg/dL (ref 7–25)
CO2: 32 mmol/L (ref 20–32)
Calcium: 9.6 mg/dL (ref 8.6–10.3)
Chloride: 102 mmol/L (ref 98–110)
Creat: 1.28 mg/dL (ref 0.70–1.35)
Globulin: 3 g/dL (calc) (ref 1.9–3.7)
Glucose, Bld: 87 mg/dL (ref 65–99)
Potassium: 4.2 mmol/L (ref 3.5–5.3)
Sodium: 139 mmol/L (ref 135–146)
Total Bilirubin: 0.3 mg/dL (ref 0.2–1.2)
Total Protein: 7.3 g/dL (ref 6.1–8.1)
eGFR: 62 mL/min/{1.73_m2} (ref >=60)

## 2022-05-15 LAB — URINALYSIS, ROUTINE W REFLEX MICROSCOPIC
Bilirubin Urine: NEGATIVE
Glucose, UA: NEGATIVE
Hgb urine dipstick: NEGATIVE
Leukocytes,Ua: NEGATIVE
Nitrite: NEGATIVE
Protein, ur: NEGATIVE
Specific Gravity, Urine: 1.027 (ref 1.001–1.035)
pH: 6.5 (ref 5.0–8.0)

## 2022-05-15 LAB — HEMOGLOBIN A1C
Hgb A1c MFr Bld: 5.8 % of total Hgb — ABNORMAL HIGH (ref <5.7)
Mean Plasma Glucose: 120 mg/dL
eAG (mmol/L): 6.6 mmol/L

## 2022-05-15 LAB — MICROALBUMIN / CREATININE URINE RATIO
Creatinine, Urine: 258 mg/dL (ref 20–320)
Microalb Creat Ratio: 5 mcg/mg creat (ref <30)
Microalb, Ur: 1.2 mg/dL

## 2022-05-15 LAB — PSA: PSA: 3.88 ng/mL (ref <=4.00)

## 2022-05-15 LAB — LIPID PANEL
Cholesterol: 174 mg/dL (ref <200)
HDL: 50 mg/dL (ref >=40)
LDL Cholesterol (Calc): 97 mg/dL (calc)
Non-HDL Cholesterol (Calc): 124 mg/dL (calc) (ref <130)
Total CHOL/HDL Ratio: 3.5 (calc) (ref <5.0)
Triglycerides: 171 mg/dL — ABNORMAL HIGH (ref <150)

## 2022-05-15 LAB — TSH: TSH: 1.36 mIU/L (ref 0.40–4.50)

## 2022-05-15 LAB — MAGNESIUM: Magnesium: 2 mg/dL (ref 1.5–2.5)

## 2022-05-15 LAB — INSULIN, RANDOM: Insulin: 21.1 u[IU]/mL — ABNORMAL HIGH

## 2022-05-15 LAB — VITAMIN D 25 HYDROXY (VIT D DEFICIENCY, FRACTURES): Vit D, 25-Hydroxy: 34 ng/mL (ref 30–100)

## 2022-05-20 NOTE — Progress Notes (Signed)
lmom 

## 2022-06-02 NOTE — Progress Notes (Signed)
Patient is aware of lab results and instructions. -e welch

## 2022-08-20 ENCOUNTER — Encounter: Payer: Self-pay | Admitting: Nurse Practitioner

## 2022-08-20 ENCOUNTER — Ambulatory Visit (INDEPENDENT_AMBULATORY_CARE_PROVIDER_SITE_OTHER): Payer: BC Managed Care – PPO | Admitting: Nurse Practitioner

## 2022-08-20 VITALS — BP 128/78 | HR 71 | Temp 98.8°F | Ht 72.0 in | Wt 206.8 lb

## 2022-08-20 DIAGNOSIS — E782 Mixed hyperlipidemia: Secondary | ICD-10-CM | POA: Diagnosis not present

## 2022-08-20 DIAGNOSIS — N529 Male erectile dysfunction, unspecified: Secondary | ICD-10-CM

## 2022-08-20 DIAGNOSIS — Z6827 Body mass index (BMI) 27.0-27.9, adult: Secondary | ICD-10-CM

## 2022-08-20 DIAGNOSIS — R0989 Other specified symptoms and signs involving the circulatory and respiratory systems: Secondary | ICD-10-CM

## 2022-08-20 DIAGNOSIS — Z79899 Other long term (current) drug therapy: Secondary | ICD-10-CM

## 2022-08-20 DIAGNOSIS — E559 Vitamin D deficiency, unspecified: Secondary | ICD-10-CM

## 2022-08-20 DIAGNOSIS — R7309 Other abnormal glucose: Secondary | ICD-10-CM

## 2022-08-20 NOTE — Patient Instructions (Signed)
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
## 2022-08-21 LAB — COMPLETE METABOLIC PANEL WITH GFR
AG Ratio: 1.5 (calc) (ref 1.0–2.5)
ALT: 15 U/L (ref 9–46)
AST: 17 U/L (ref 10–35)
Albumin: 4.2 g/dL (ref 3.6–5.1)
Alkaline phosphatase (APISO): 68 U/L (ref 35–144)
BUN: 10 mg/dL (ref 7–25)
CO2: 29 mmol/L (ref 20–32)
Calcium: 9.6 mg/dL (ref 8.6–10.3)
Chloride: 104 mmol/L (ref 98–110)
Creat: 1.12 mg/dL (ref 0.70–1.35)
Globulin: 2.8 g/dL (calc) (ref 1.9–3.7)
Glucose, Bld: 88 mg/dL (ref 65–99)
Potassium: 4.2 mmol/L (ref 3.5–5.3)
Sodium: 141 mmol/L (ref 135–146)
Total Bilirubin: 0.5 mg/dL (ref 0.2–1.2)
Total Protein: 7 g/dL (ref 6.1–8.1)
eGFR: 72 mL/min/{1.73_m2} (ref >=60)

## 2022-08-21 LAB — CBC WITH DIFFERENTIAL/PLATELET
Absolute Monocytes: 311 cells/uL (ref 200–950)
Basophils Absolute: 29 cells/uL (ref 0–200)
Basophils Relative: 0.7 %
Eosinophils Absolute: 151 cells/uL (ref 15–500)
Eosinophils Relative: 3.6 %
HCT: 40.9 % (ref 38.5–50.0)
Hemoglobin: 13.6 g/dL (ref 13.2–17.1)
Lymphs Abs: 1453 cells/uL (ref 850–3900)
MCH: 27.8 pg (ref 27.0–33.0)
MCHC: 33.3 g/dL (ref 32.0–36.0)
MCV: 83.6 fL (ref 80.0–100.0)
MPV: 9.7 fL (ref 7.5–12.5)
Monocytes Relative: 7.4 %
Neutro Abs: 2255 cells/uL (ref 1500–7800)
Neutrophils Relative %: 53.7 %
Platelets: 300 10*3/uL (ref 140–400)
RBC: 4.89 10*6/uL (ref 4.20–5.80)
RDW: 13 % (ref 11.0–15.0)
Total Lymphocyte: 34.6 %
WBC: 4.2 10*3/uL (ref 3.8–10.8)

## 2022-08-21 LAB — LIPID PANEL
Cholesterol: 195 mg/dL (ref <200)
HDL: 55 mg/dL (ref >=40)
LDL Cholesterol (Calc): 122 mg/dL (calc) — ABNORMAL HIGH
Non-HDL Cholesterol (Calc): 140 mg/dL (calc) — ABNORMAL HIGH (ref <130)
Total CHOL/HDL Ratio: 3.5 (calc) (ref <5.0)
Triglycerides: 84 mg/dL (ref <150)

## 2022-08-21 LAB — VITAMIN D 25 HYDROXY (VIT D DEFICIENCY, FRACTURES): Vit D, 25-Hydroxy: 28 ng/mL — ABNORMAL LOW (ref 30–100)

## 2022-08-21 LAB — HEMOGLOBIN A1C
Hgb A1c MFr Bld: 5.9 % of total Hgb — ABNORMAL HIGH (ref <5.7)
Mean Plasma Glucose: 123 mg/dL
eAG (mmol/L): 6.8 mmol/L

## 2022-10-23 ENCOUNTER — Emergency Department (HOSPITAL_COMMUNITY): Payer: BC Managed Care – PPO

## 2022-10-23 ENCOUNTER — Observation Stay (HOSPITAL_COMMUNITY): Payer: BC Managed Care – PPO

## 2022-10-23 ENCOUNTER — Other Ambulatory Visit: Payer: Self-pay

## 2022-10-23 ENCOUNTER — Encounter (HOSPITAL_COMMUNITY): Payer: Self-pay

## 2022-10-23 ENCOUNTER — Inpatient Hospital Stay (HOSPITAL_COMMUNITY)
Admission: EM | Admit: 2022-10-23 | Discharge: 2022-10-24 | DRG: 065 | Disposition: A | Discharge status: Home Health | Payer: BC Managed Care – PPO | Attending: Family Medicine | Admitting: Family Medicine

## 2022-10-23 DIAGNOSIS — R112 Nausea with vomiting, unspecified: Secondary | ICD-10-CM | POA: Diagnosis present

## 2022-10-23 DIAGNOSIS — I6381 Other cerebral infarction due to occlusion or stenosis of small artery: Principal | ICD-10-CM | POA: Diagnosis present

## 2022-10-23 DIAGNOSIS — R7309 Other abnormal glucose: Secondary | ICD-10-CM | POA: Diagnosis present

## 2022-10-23 DIAGNOSIS — H532 Diplopia: Secondary | ICD-10-CM | POA: Diagnosis present

## 2022-10-23 DIAGNOSIS — Z79899 Other long term (current) drug therapy: Secondary | ICD-10-CM

## 2022-10-23 DIAGNOSIS — E782 Mixed hyperlipidemia: Secondary | ICD-10-CM | POA: Diagnosis present

## 2022-10-23 DIAGNOSIS — J302 Other seasonal allergic rhinitis: Secondary | ICD-10-CM | POA: Diagnosis present

## 2022-10-23 DIAGNOSIS — M1990 Unspecified osteoarthritis, unspecified site: Secondary | ICD-10-CM | POA: Diagnosis present

## 2022-10-23 DIAGNOSIS — R0989 Other specified symptoms and signs involving the circulatory and respiratory systems: Secondary | ICD-10-CM | POA: Diagnosis present

## 2022-10-23 DIAGNOSIS — R42 Dizziness and giddiness: Secondary | ICD-10-CM

## 2022-10-23 DIAGNOSIS — I639 Cerebral infarction, unspecified: Principal | ICD-10-CM

## 2022-10-23 DIAGNOSIS — Z7982 Long term (current) use of aspirin: Secondary | ICD-10-CM

## 2022-10-23 DIAGNOSIS — R972 Elevated prostate specific antigen [PSA]: Secondary | ICD-10-CM | POA: Diagnosis present

## 2022-10-23 DIAGNOSIS — E876 Hypokalemia: Secondary | ICD-10-CM | POA: Diagnosis present

## 2022-10-23 DIAGNOSIS — R7303 Prediabetes: Secondary | ICD-10-CM | POA: Diagnosis present

## 2022-10-23 DIAGNOSIS — I6389 Other cerebral infarction: Secondary | ICD-10-CM | POA: Diagnosis not present

## 2022-10-23 DIAGNOSIS — I10 Essential (primary) hypertension: Secondary | ICD-10-CM | POA: Diagnosis present

## 2022-10-23 DIAGNOSIS — E559 Vitamin D deficiency, unspecified: Secondary | ICD-10-CM | POA: Diagnosis present

## 2022-10-23 DIAGNOSIS — I471 Supraventricular tachycardia, unspecified: Secondary | ICD-10-CM | POA: Diagnosis not present

## 2022-10-23 DIAGNOSIS — Z888 Allergy status to other drugs, medicaments and biological substances status: Secondary | ICD-10-CM | POA: Diagnosis not present

## 2022-10-23 DIAGNOSIS — R29700 NIHSS score 0: Secondary | ICD-10-CM | POA: Diagnosis present

## 2022-10-23 DIAGNOSIS — Z8249 Family history of ischemic heart disease and other diseases of the circulatory system: Secondary | ICD-10-CM

## 2022-10-23 DIAGNOSIS — R1310 Dysphagia, unspecified: Secondary | ICD-10-CM | POA: Diagnosis present

## 2022-10-23 LAB — COMPREHENSIVE METABOLIC PANEL
ALT: 16 U/L (ref 0–44)
AST: 17 U/L (ref 15–41)
Albumin: 3.9 g/dL (ref 3.5–5.0)
Alkaline Phosphatase: 64 U/L (ref 38–126)
Anion gap: 8 (ref 5–15)
BUN: 12 mg/dL (ref 8–23)
CO2: 26 mmol/L (ref 22–32)
Calcium: 8.9 mg/dL (ref 8.9–10.3)
Chloride: 103 mmol/L (ref 98–111)
Creatinine, Ser: 1.11 mg/dL (ref 0.61–1.24)
GFR, Estimated: >60 mL/min (ref >60)
Glucose, Bld: 175 mg/dL — ABNORMAL HIGH (ref 70–99)
Potassium: 3.1 mmol/L — ABNORMAL LOW (ref 3.5–5.1)
Sodium: 137 mmol/L (ref 135–145)
Total Bilirubin: 0.6 mg/dL (ref 0.3–1.2)
Total Protein: 7.4 g/dL (ref 6.5–8.1)

## 2022-10-23 LAB — CBC
HCT: 41 % (ref 39.0–52.0)
Hemoglobin: 13.1 g/dL (ref 13.0–17.0)
MCH: 27.2 pg (ref 26.0–34.0)
MCHC: 32 g/dL (ref 30.0–36.0)
MCV: 85.2 fL (ref 80.0–100.0)
Platelets: 297 10*3/uL (ref 150–400)
RBC: 4.81 MIL/uL (ref 4.22–5.81)
RDW: 13.7 % (ref 11.5–15.5)
WBC: 5.5 10*3/uL (ref 4.0–10.5)
nRBC: 0 % (ref 0.0–0.2)

## 2022-10-23 LAB — I-STAT CHEM 8, ED
BUN: 10 mg/dL (ref 8–23)
Calcium, Ion: 1.25 mmol/L (ref 1.15–1.40)
Chloride: 102 mmol/L (ref 98–111)
Creatinine, Ser: 1 mg/dL (ref 0.61–1.24)
Glucose, Bld: 172 mg/dL — ABNORMAL HIGH (ref 70–99)
HCT: 42 % (ref 39.0–52.0)
Hemoglobin: 14.3 g/dL (ref 13.0–17.0)
Potassium: 3.3 mmol/L — ABNORMAL LOW (ref 3.5–5.1)
Sodium: 141 mmol/L (ref 135–145)
TCO2: 25 mmol/L (ref 22–32)

## 2022-10-23 LAB — URINALYSIS, ROUTINE W REFLEX MICROSCOPIC
Bilirubin Urine: NEGATIVE
Glucose, UA: NEGATIVE mg/dL
Hgb urine dipstick: NEGATIVE
Ketones, ur: NEGATIVE mg/dL
Leukocytes,Ua: NEGATIVE
Nitrite: NEGATIVE
Protein, ur: NEGATIVE mg/dL
Specific Gravity, Urine: >1.046 — ABNORMAL HIGH (ref 1.005–1.030)
pH: 7 (ref 5.0–8.0)

## 2022-10-23 LAB — HEMOGLOBIN A1C
Hgb A1c MFr Bld: 5.6 % (ref 4.8–5.6)
Mean Plasma Glucose: 114.02 mg/dL

## 2022-10-23 LAB — ECHOCARDIOGRAM COMPLETE
Area-P 1/2: 3.03 cm2
Calc EF: 59.7 %
Height: 74 in
S' Lateral: 2.6 cm
Single Plane A2C EF: 61.4 %
Single Plane A4C EF: 59.7 %
Weight: 3280 oz

## 2022-10-23 LAB — DIFFERENTIAL
Abs Immature Granulocytes: 0.02 10*3/uL (ref 0.00–0.07)
Basophils Absolute: 0 10*3/uL (ref 0.0–0.1)
Basophils Relative: 0 %
Eosinophils Absolute: 0.2 10*3/uL (ref 0.0–0.5)
Eosinophils Relative: 4 %
Immature Granulocytes: 0 %
Lymphocytes Relative: 28 %
Lymphs Abs: 1.5 10*3/uL (ref 0.7–4.0)
Monocytes Absolute: 0.3 10*3/uL (ref 0.1–1.0)
Monocytes Relative: 6 %
Neutro Abs: 3.4 10*3/uL (ref 1.7–7.7)
Neutrophils Relative %: 62 %

## 2022-10-23 LAB — RAPID URINE DRUG SCREEN, HOSP PERFORMED
Amphetamines: NOT DETECTED
Barbiturates: NOT DETECTED
Benzodiazepines: NOT DETECTED
Cocaine: NOT DETECTED
Opiates: NOT DETECTED
Tetrahydrocannabinol: NOT DETECTED

## 2022-10-23 LAB — GLUCOSE, CAPILLARY
Glucose-Capillary: 125 mg/dL — ABNORMAL HIGH (ref 70–99)
Glucose-Capillary: 124 mg/dL — ABNORMAL HIGH (ref 70–99)

## 2022-10-23 LAB — CBG MONITORING, ED
Glucose-Capillary: 170 mg/dL — ABNORMAL HIGH (ref 70–99)
Glucose-Capillary: 118 mg/dL — ABNORMAL HIGH (ref 70–99)

## 2022-10-23 LAB — MAGNESIUM
Magnesium: 1.9 mg/dL (ref 1.7–2.4)
Magnesium: 1.6 mg/dL — ABNORMAL LOW (ref 1.7–2.4)

## 2022-10-23 LAB — PHOSPHORUS: Phosphorus: 1.6 mg/dL — ABNORMAL LOW (ref 2.5–4.6)

## 2022-10-23 LAB — APTT: aPTT: 23 seconds — ABNORMAL LOW (ref 24–36)

## 2022-10-23 LAB — PROTIME-INR
INR: 1 (ref 0.8–1.2)
Prothrombin Time: 12.8 seconds (ref 11.4–15.2)

## 2022-10-23 LAB — ETHANOL: Alcohol, Ethyl (B): <10 mg/dL (ref <10)

## 2022-10-23 MED ORDER — ONDANSETRON HCL 4 MG/2ML IJ SOLN
4.0000 mg | Freq: Once | INTRAMUSCULAR | Status: AC
  Administered 2022-10-23: 4 mg via INTRAVENOUS
  Filled 2022-10-23: qty 2

## 2022-10-23 MED ORDER — STROKE: EARLY STAGES OF RECOVERY BOOK
Freq: Once | Status: AC
  Filled 2022-10-23: qty 1

## 2022-10-23 MED ORDER — POTASSIUM CHLORIDE 10 MEQ/100ML IV SOLN
10.0000 meq | Freq: Once | INTRAVENOUS | Status: DC
  Administered 2022-10-23: 10 meq via INTRAVENOUS
  Filled 2022-10-23: qty 100

## 2022-10-23 MED ORDER — ASPIRIN 81 MG PO TBEC
81.0000 mg | DELAYED_RELEASE_TABLET | Freq: Every day | ORAL | Status: DC
  Administered 2022-10-23 – 2022-10-24 (×2): 81 mg via ORAL
  Filled 2022-10-23 (×2): qty 1

## 2022-10-23 MED ORDER — MAGNESIUM SULFATE 2 GM/50ML IV SOLN
2.0000 g | Freq: Once | INTRAVENOUS | Status: AC
  Administered 2022-10-23: 2 g via INTRAVENOUS
  Filled 2022-10-23: qty 50

## 2022-10-23 MED ORDER — ENOXAPARIN SODIUM 40 MG/0.4ML IJ SOSY
40.0000 mg | PREFILLED_SYRINGE | INTRAMUSCULAR | Status: DC
  Administered 2022-10-23: 40 mg via SUBCUTANEOUS
  Filled 2022-10-23: qty 0.4

## 2022-10-23 MED ORDER — K PHOS MONO-SOD PHOS DI & MONO 155-852-130 MG PO TABS
500.0000 mg | ORAL_TABLET | Freq: Four times a day (QID) | ORAL | Status: AC
  Administered 2022-10-23 – 2022-10-24 (×5): 500 mg via ORAL
  Filled 2022-10-23 (×6): qty 2

## 2022-10-23 MED ORDER — ACETAMINOPHEN 650 MG RE SUPP: 650.0000 mg | Freq: Four times a day (QID) | RECTAL | Status: DC | PRN

## 2022-10-23 MED ORDER — IOHEXOL 350 MG/ML SOLN
75.0000 mL | Freq: Once | INTRAVENOUS | Status: AC | PRN
  Administered 2022-10-23: 75 mL via INTRAVENOUS

## 2022-10-23 MED ORDER — SODIUM CHLORIDE (PF) 0.9 % IJ SOLN
INTRAMUSCULAR | Status: AC
  Filled 2022-10-23: qty 50

## 2022-10-23 MED ORDER — ONDANSETRON HCL 4 MG/2ML IJ SOLN
4.0000 mg | Freq: Four times a day (QID) | INTRAMUSCULAR | Status: DC | PRN
  Administered 2022-10-23: 4 mg via INTRAVENOUS
  Filled 2022-10-23 (×2): qty 2

## 2022-10-23 MED ORDER — POTASSIUM CHLORIDE IN NACL 20-0.9 MEQ/L-% IV SOLN
INTRAVENOUS | Status: AC
  Filled 2022-10-23 (×3): qty 1000

## 2022-10-23 MED ORDER — ONDANSETRON HCL 4 MG PO TABS: 4.0000 mg | ORAL_TABLET | Freq: Four times a day (QID) | ORAL | Status: DC | PRN

## 2022-10-23 MED ORDER — POTASSIUM CHLORIDE IN NACL 40-0.9 MEQ/L-% IV SOLN
INTRAVENOUS | Status: DC
Start: 2022-10-23 — End: 2022-10-23

## 2022-10-23 MED ORDER — ACETAMINOPHEN 325 MG PO TABS: 650.0000 mg | ORAL_TABLET | Freq: Four times a day (QID) | ORAL | Status: DC | PRN

## 2022-10-23 MED ORDER — INSULIN ASPART 100 UNIT/ML IJ SOLN
0.0000 [IU] | Freq: Three times a day (TID) | INTRAMUSCULAR | Status: DC
  Administered 2022-10-23: 2 [IU] via SUBCUTANEOUS
  Filled 2022-10-23: qty 0.15

## 2022-10-23 MED ORDER — POTASSIUM CHLORIDE CRYS ER 20 MEQ PO TBCR
40.0000 meq | EXTENDED_RELEASE_TABLET | Freq: Once | ORAL | Status: AC
  Administered 2022-10-23: 40 meq via ORAL
  Filled 2022-10-23: qty 2

## 2022-10-23 NOTE — Consult Note (Signed)
[facility] TeleNeurology Consult Services   Patient Name:   Adam Berry, Adam Berry Date of Birth:   [birth year] Identification Number:   MRN - [id number] Date of Service:   10/23/2022 05:49:46  Diagnosis:       R26.89 - Imbalance       H53.2 - Diplopia  Impression:      67 year old gentleman with a history significant for hypertension and hyperlipidemia presenting with acute vertigo with diplopia and left medial palsy concerning for posterior circulation stroke. CT scan of the head did not show any acute findings. No hemorrhage. Given his posterior circulation symptoms CT angiogram was performed stat and did not reveal any basilar compromise. He does have very small left vertebral artery and a dominant right vertebral. Intracranial vasculature shows no LVO. Recommend continued aspirin and plan for admission for further workup for stroke. Recommend check lipid panel and optimize statin if LDL greater than 70, check A1c, continue with cardiac telemetry. Appreciate therapy services.    ** Per facility request will defer further work up, management, and referrals to inpatient service, inclusive of inpatient neurology consult.      I evaluated for this encounter only for stroke alert.    Recommend Neurology to follow. Please reconsult if/as needed if inhouse Neurology not available.    Thank you very much for allowing me to partake in this patient's care.    Please note that portions of the note were completed using Dragon voice recognition software  Our recommendations are outlined below.  Recommendations:        Stroke/Telemetry Floor       Neuro Checks       Bedside Swallow Eval       DVT Prophylaxis       IV Fluids, Normal Saline       Head of Bed 30 Degrees       Euglycemia and Avoid Hyperthermia (PRN Acetaminophen)       Initiate or continue Aspirin 81 MG daily       Antihypertensives PRN if Blood pressure is greater than 220/120 or there is a concern for End organ  damage/contraindications for permissive HTN. If blood pressure is greater than 220/120 give labetalol PO or IV or Vasotec IV with a goal of 15% reduction in BP during the first 24 hours.  Sign Out:       Discussed with Emergency Department Provider    ------------------------------------------------------------------------------  Advanced Imaging: CTA Head and Neck Completed.  LVO:No  Patient in not a candidate for NIR   Metrics: Last Known Well: 10/22/2022 22:00:00 [facility] Notification Time: 10/23/2022 05:49:46 Arrival Time: 10/23/2022 05:17:00 Stamp Time: 10/23/2022 05:49:46 Initial Response Time: 10/23/2022 05:52:02 Symptoms: Dizziness. Initial patient interaction: 10/23/2022 06:00:30 NIHSS Assessment Completed: 10/23/2022 06:05:48 Patient is not a candidate for Thrombolytic. Thrombolytic Medical Decision: 10/23/2022 06:05:55 Patient was not deemed candidate for Thrombolytic because of following reasons: Last Well Known Above 4.5 Hours.  I personally Reviewed the CT Head and it Showed neg hemorrhage or acute hemorrhage  Primary Provider Notified of Diagnostic Impression and Management Plan on: 10/23/2022 07:05:42    ------------------------------------------------------------------------------  History of Present Illness: Patient is a 67 year old Male.  Patient was brought by private transportation with symptoms of Dizziness. Patient is a 67 year old gentleman with a history significant for hypertension, hyperlipidemia who presented acutely to the emergency room with double vision along with nausea and imbalance with vertigo. Patient noted that he went to bed last night at 2200 and was at his baseline self.  Patient completely independent and still working in terms of his level of function. He woke up this morning and had significant symptoms already of dizziness along with double vision. He was subsequently then brought here for further evaluation where he was  seen as a stroke alert. Patient was outside of the treatment window for thrombolytic therapy. NIHSS was 0 however he had significant medial rectus palsy in his right eye. Extraocular movements intact and normal left eye however right eye noted inability to cross midline and disconjugate with neutral gaze. CT scan of the head did not show any acute findings. He denied any headache or any neck pain. No neck trauma as well. Patient notes that he does take aspirin daily.    Past Medical History:      Hypertension      Hyperlipidemia  Medications:  No Anticoagulant use  Antiplatelet use: Yes ASA Reviewed EMR for current medications  Allergies:  Reviewed  Social History: Alcohol Use: Yes  Family History:  There is no family history of premature cerebrovascular disease pertinent to this consultation  ROS : 14 Points Review of Systems was performed and was negative except mentioned in HPI.  Past Surgical History: There Is No Surgical History Contributory To Today's Visit     Examination: BP(140/80), Pulse(71), 1A: Level of Consciousness - Alert; keenly responsive + 0 1B: Ask Month and Age - Both Questions Right + 0 1C: Blink Eyes & Squeeze Hands - Performs Both Tasks + 0 2: Test Horizontal Extraocular Movements - Normal + 0 3: Test Visual Fields - No Visual Loss + 0 4: Test Facial Palsy (Use Grimace if Obtunded) - Normal symmetry + 0 5A: Test Left Arm Motor Drift - No Drift for 10 Seconds + 0 5B: Test Right Arm Motor Drift - No Drift for 10 Seconds + 0 6A: Test Left Leg Motor Drift - No Drift for 5 Seconds + 0 6B: Test Right Leg Motor Drift - No Drift for 5 Seconds + 0 7: Test Limb Ataxia (FNF/Heel-Shin) - No Ataxia + 0 8: Test Sensation - Normal; No sensory loss + 0 9: Test Language/Aphasia - Normal; No aphasia + 0 10: Test Dysarthria - Normal + 0 11: Test Extinction/Inattention - No abnormality + 0  NIHSS Score: 0  NIHSS Free Text : Disconjugate Gaze with medial gaze  palsy R, does not cross midline. L eye full EOMI, neg ptosis  Pre-Morbid Modified Rankin Scale: 0 Points = No symptoms at all  Spoke with : Dr Preston Fleeting  Patient/Family was informed the Neurology Consult would occur via TeleHealth consult by way of interactive audio and video telecommunications and consented to receiving care in this manner.   Patient is being evaluated for possible acute neurologic impairment and high probability of imminent or life-threatening deterioration. I spent total of 35 minutes providing care to this patient, including time for face to face visit via telemedicine, review of medical records, imaging studies and discussion of findings with providers, the patient and/or family.   Dr Fortino Sic   [facility] For Inpatient follow-up with [facility] physician please call [facility] [phone]. This is not an outpatient service. Post hospital discharge, please contact hospital directly.  Please do not communicate with [facility] physicians via secure chat. If you have any questions, Please contact [facility]. Please call or reconsult our service if there are any clinical or diagnostic changes.

## 2022-10-23 NOTE — Plan of Care (Signed)

## 2022-10-23 NOTE — Progress Notes (Signed)
Echocardiogram 2D Echocardiogram has been performed.  Toni Amend 10/23/2022, 11:19 AM

## 2022-10-23 NOTE — ED Notes (Signed)
Patient transported to CT 

## 2022-10-23 NOTE — Progress Notes (Signed)
Pt arrived to unit

## 2022-10-23 NOTE — Evaluation (Signed)
SLP Cancellation Note  Patient Details Name: Adam Berry MRN: [id number] DOB: 23-Jul-1955   Cancelled treatment:       Reason Eval/Treat Not Completed: Other (comment) (pt being transferred to [facility] at this time; on stretcher with care link) Rolena Infante, MS [facility] SLP Acute Rehab Services Office [phone]   Chales Abrahams 10/23/2022, 3:29 PM
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
## 2022-10-23 NOTE — ED Notes (Signed)
1610- Code Stroke cart activated- LKW 2200, pt woke with n/v, dizziness, and blurry vision. BP 140/80, glucose 170.  0546 pt to CT 0549 TS paged [phone] Dr Cyndie Chime on camera [phone] pt back from CT  Laurann Montana, RN Telestroke nurse

## 2022-10-23 NOTE — ED Notes (Signed)
Dr. Cyndie Chime on neuro telescreen to assess patient

## 2022-10-23 NOTE — ED Provider Notes (Signed)
[place] EMERGENCY DEPARTMENT AT [facility] Provider Note   CSN: [id number] Arrival date & time: 10/23/22  7829     History  Chief Complaint  Patient presents with   Nausea   Emesis    Adam Berry is a 67 y.o. male.  The history is provided by the patient.  Emesis He has history of hyperlipidemia and states that he woke up at 4 AM and noted double vision and dizziness.  He had gone to sleep at 10 AM.  He does endorse some nausea.  Has not noted anything that makes his dizziness or double vision worse.  Denies any weakness in his arms or legs.  Denies any headache, tinnitus, hearing loss, ear pain.   Home Medications Prior to Admission medications   Medication Sig Start Date End Date Taking? Authorizing Provider  APPLE CIDER VINEGAR PO Take by mouth daily.    [provider]  aspirin 81 MG tablet Take 81 mg by mouth daily.    [provider]  Cholecalciferol (VITAMIN D3) 125 MCG (5000 UT) CAPS Take by mouth daily.    [provider]  Multiple Vitamin (MULTIVITAMIN) tablet Take 1 tablet by mouth daily.    [provider]  rosuvastatin (CRESTOR) 5 MG tablet Take  1 tablet  every other day - Even Days of the Month for Cholesterol Patient not taking: Reported on 05/13/2022 10/29/20   Lucky Cowboy, MD  UNABLE TO FIND Med Name: [facility] oil    [provider]  vitamin C (ASCORBIC ACID) 500 MG tablet Take 500 mg by mouth daily.    [provider]  Zinc 50 MG TABS Take by mouth daily.    [provider]      Allergies    Ppd [tuberculin purified protein derivative]    Review of Systems   Review of Systems  Gastrointestinal:  Positive for vomiting.  All other systems reviewed and are negative.   Physical Exam Updated Vital Signs BP (!) 140/80 (BP Location: Right Arm)   Pulse 71   Temp 97.8 F (36.6 C) (Oral)   Resp 15   Ht  (1.88 m)   Wt 93 kg   SpO2 98%   BMI 26.32 kg/m   Physical Exam Vitals and nursing note reviewed.   67 year old male, resting comfortably and in no acute distress. Vital signs are normal. Oxygen saturation is 98%, which is normal. Head is normocephalic and atraumatic.  Pupils are 3 mm and reactive.  Gaze is conjugate when he looks to the left, but when gaze goes past the midline to the right, the right eye deviates all the way to the right.  Remainder of extraocular movements are normal, no nystagmus seen. Oropharynx is clear. Neck is nontender and supple without adenopathy or JVD.  There are no carotid bruits. Back is nontender and there is no CVA tenderness. Lungs are clear without rales, wheezes, or rhonchi. Chest is nontender. Heart has regular rate and rhythm without murmur. Abdomen is soft, flat, nontender. Extremities have no cyanosis or edema, full range of motion is present. Skin is warm and dry without rash. Neurologic: Mental status is normal, cranial nerves are intact except as noted above, strength is 5/5 in all 4 extremities, there is no pronator drift.  He is unable to cooperate for finger-to-nose testing, but is able to touch his nose smoothly with his eyes closed.  ED Results / Procedures / Treatments   Labs (all labs ordered  are listed, but only abnormal results are displayed) Labs Reviewed  APTT - Abnormal; Notable for the following components:      Result Value   aPTT 23 (*)    All other components within normal limits  COMPREHENSIVE METABOLIC PANEL - Abnormal; Notable for the following components:   Potassium 3.1 (*)    Glucose, Bld 175 (*)    All other components within normal limits  I-STAT CHEM 8, ED - Abnormal; Notable for the following components:   Potassium 3.3 (*)    Glucose, Bld 172 (*)    All other components within normal limits  CBG MONITORING, ED - Abnormal; Notable for the following components:   Glucose-Capillary 170 (*)    All other components within normal limits  ETHANOL  PROTIME-INR  CBC   DIFFERENTIAL  RAPID URINE DRUG SCREEN, HOSP PERFORMED  URINALYSIS, ROUTINE W REFLEX MICROSCOPIC  MAGNESIUM    EKG EKG Interpretation  Date/Time:  Thursday October 23 2022 05:41:06 EDT Ventricular Rate:  72 PR Interval:  193 QRS Duration: 105 QT Interval:  387 QTC Calculation: 424 R Axis:   -30 Text Interpretation: Sinus rhythm Atrial premature complex Abnormal R-wave progression, early transition Left ventricular hypertrophy Borderline T abnormalities, inferior leads No old tracing to compare Confirmed by Dione Booze ([id number]) on 10/23/2022 5:42:43 AM  Radiology CT HEAD CODE STROKE WO CONTRAST  Result Date: 10/23/2022 CLINICAL DATA:  Code stroke. 67 year old male with dizziness, abnormal vision. Neurologic deficit. EXAM: CT HEAD WITHOUT CONTRAST TECHNIQUE: Contiguous axial images were obtained from the base of the skull through the vertex without intravenous contrast. RADIATION DOSE REDUCTION: This exam was performed according to the departmental dose-optimization program which includes automated exposure control, adjustment of the mA and/or kV according to patient size and/or use of iterative reconstruction technique. COMPARISON:  None Available. FINDINGS: Brain: Small chronic lacunar infarct of the right caudate nucleus series 4, image 15. Background cerebral volume within normal limits for age. No midline shift, ventriculomegaly, mass effect, evidence of mass lesion, intracranial hemorrhage or evidence of cortically based acute infarction. Outside of the right caudate gray-white differentiation is within normal limits. No other encephalomalacia identified. Vascular: Mild Calcified atherosclerosis at the skull base. Dominant appearing distal right vertebral artery. Some generalized intracranial artery tortuosity. No suspicious intracranial vascular hyperdensity. Skull: No acute osseous abnormality identified. Sinuses/Orbits: Minor paranasal sinus mucosal thickening. Tympanic cavities and  mastoids appear well aerated. Other: No gaze deviation. Visualized orbits and scalp soft tissues are within normal limits. ASPECTS [facility] Stroke Program Early CT Score) Total score (0-10 with 10 being normal): 10 IMPRESSION: 1. No acute cortically based infarct or acute intracranial hemorrhage identified. ASPECTS 10. 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 2. Study discussed by telephone with Dr. Dione Booze on 10/23/2022 at 06:02 . Electronically Signed   By: Odessa Fleming M.D.   On: 10/23/2022 06:03    Procedures Procedures  Cardiac monitor shows normal sinus rhythm, per my interpretation.  Medications Ordered in ED Medications - No data to display  ED Course/ Medical Decision Making/ A&P                             Medical Decision Making Amount and/or Complexity of Data Reviewed Labs: ordered. Radiology: ordered.  Risk Prescription drug management. Decision regarding hospitalization.   Vertigo with abnormal extraocular movements concerning for stroke.  Last known normal was 10 PM which would put him outside the window for  thrombolytic therapy but he may still be a candidate for endovascular therapy.  Therefore, I have activated code stroke.  I have reviewed and interpreted his electrocardiogram and my interpretation is left ventricular hypertrophy, PACs, nonspecific T wave abnormality, no prior ECG available for comparison.  CT head shows no acute process, old lacunar infarct present.  I have independently viewed the images, and agree with radiologist interpretation.  I am also directly discussed the findings with the radiologist.  I have also discussed case with neurologist who is concerned about posterior circulation stroke and is requesting CT angiogram of head and neck.  I have ordered this.  Neurologist as discussed CT angiogram and does not feel it shows anything acute.  I have independently viewed the images and agree with his interpretation but radiologist  interpretation is pending.  Patient continues to complain of nausea and diplopia.  I have reviewed and interpreted his laboratory tests, and my interpretation is elevated random glucose, hypokalemia, normal CBC.  I have ordered oral and intravenous potassium.  He will need to be admitted for stroke evaluation including MRI scan.  Case is signed out to Dr. Anitra Lauth to arrange this.  CRITICAL CARE Performed by: Dione Booze Total critical care time: 45 minutes Critical care time was exclusive of separately billable procedures and treating other patients. Critical care was necessary to treat or prevent imminent or life-threatening deterioration. Critical care was time spent personally by me on the following activities: development of treatment plan with patient and/or surrogate as well as nursing, discussions with consultants, evaluation of patient's response to treatment, examination of patient, obtaining history from patient or surrogate, ordering and performing treatments and interventions, ordering and review of laboratory studies, ordering and review of radiographic studies, pulse oximetry and re-evaluation of patient's condition.  Final Clinical Impression(s) / ED Diagnoses Final diagnoses:  Cerebrovascular accident (CVA), unspecified mechanism  Dizziness  Diplopia  Hypokalemia    Rx / DC Orders ED Discharge Orders     None         Dione Booze, MD 10/23/22 [phone]
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
## 2022-10-23 NOTE — ED Notes (Signed)
Patient returned from CT

## 2022-10-23 NOTE — ED Triage Notes (Signed)
Patient called EMS for nausea vomiting and dizziness with blurred vision. Per EMS patient found on hands and knees on arrival. Patient reported to EMS he was having dizziness and blurred/double vision. EMS performed stroke screen reported screen was negative. Patient alert during triage and able to answer questions appropriately

## 2022-10-23 NOTE — ED Notes (Signed)
Oralia Manis RN on neuro telescreen code stroke activated

## 2022-10-24 ENCOUNTER — Other Ambulatory Visit: Payer: Self-pay | Admitting: Cardiology

## 2022-10-24 ENCOUNTER — Other Ambulatory Visit (HOSPITAL_COMMUNITY): Payer: Self-pay

## 2022-10-24 DIAGNOSIS — I639 Cerebral infarction, unspecified: Secondary | ICD-10-CM

## 2022-10-24 DIAGNOSIS — H532 Diplopia: Secondary | ICD-10-CM | POA: Diagnosis not present

## 2022-10-24 LAB — CBC
HCT: 37.7 % — ABNORMAL LOW (ref 39.0–52.0)
Hemoglobin: 12.3 g/dL — ABNORMAL LOW (ref 13.0–17.0)
MCH: 27.5 pg (ref 26.0–34.0)
MCHC: 32.6 g/dL (ref 30.0–36.0)
MCV: 84.2 fL (ref 80.0–100.0)
Platelets: 276 10*3/uL (ref 150–400)
RBC: 4.48 MIL/uL (ref 4.22–5.81)
RDW: 14.1 % (ref 11.5–15.5)
WBC: 9 10*3/uL (ref 4.0–10.5)
nRBC: 0 % (ref 0.0–0.2)

## 2022-10-24 LAB — COMPREHENSIVE METABOLIC PANEL
ALT: 13 U/L (ref 0–44)
AST: 13 U/L — ABNORMAL LOW (ref 15–41)
Albumin: 3.2 g/dL — ABNORMAL LOW (ref 3.5–5.0)
Alkaline Phosphatase: 61 U/L (ref 38–126)
Anion gap: 9 (ref 5–15)
BUN: 8 mg/dL (ref 8–23)
CO2: 27 mmol/L (ref 22–32)
Calcium: 9 mg/dL (ref 8.9–10.3)
Chloride: 104 mmol/L (ref 98–111)
Creatinine, Ser: 1.08 mg/dL (ref 0.61–1.24)
GFR, Estimated: >60 mL/min (ref >60)
Glucose, Bld: 92 mg/dL (ref 70–99)
Potassium: 3.8 mmol/L (ref 3.5–5.1)
Sodium: 140 mmol/L (ref 135–145)
Total Bilirubin: 0.6 mg/dL (ref 0.3–1.2)
Total Protein: 6.1 g/dL — ABNORMAL LOW (ref 6.5–8.1)

## 2022-10-24 LAB — GLUCOSE, CAPILLARY
Glucose-Capillary: 77 mg/dL (ref 70–99)
Glucose-Capillary: 115 mg/dL — ABNORMAL HIGH (ref 70–99)
Glucose-Capillary: 94 mg/dL (ref 70–99)

## 2022-10-24 LAB — LIPID PANEL
Cholesterol: 187 mg/dL (ref 0–200)
HDL: 45 mg/dL (ref >40)
LDL Cholesterol: 125 mg/dL — ABNORMAL HIGH (ref 0–99)
Total CHOL/HDL Ratio: 4.2 RATIO
Triglycerides: 84 mg/dL (ref <150)
VLDL: 17 mg/dL (ref 0–40)

## 2022-10-24 LAB — HIV ANTIBODY (ROUTINE TESTING W REFLEX): HIV Screen 4th Generation wRfx: NONREACTIVE

## 2022-10-24 MED ORDER — CLOPIDOGREL BISULFATE 75 MG PO TABS
75.0000 mg | ORAL_TABLET | Freq: Every day | ORAL | 0 refills | Status: DC
  Filled 2022-10-24: qty 21, 21d supply, fill #0

## 2022-10-24 MED ORDER — ROSUVASTATIN CALCIUM 20 MG PO TABS
20.0000 mg | ORAL_TABLET | Freq: Every day | ORAL | 3 refills | Status: DC
  Filled 2022-10-24: qty 30, 30d supply, fill #0

## 2022-10-24 MED ORDER — ROSUVASTATIN CALCIUM 20 MG PO TABS
20.0000 mg | ORAL_TABLET | Freq: Every day | ORAL | Status: DC
  Administered 2022-10-24: 20 mg via ORAL
  Filled 2022-10-24: qty 1

## 2022-10-24 MED ORDER — CLOPIDOGREL BISULFATE 75 MG PO TABS
75.0000 mg | ORAL_TABLET | Freq: Every day | ORAL | Status: DC
  Administered 2022-10-24: 75 mg via ORAL
  Filled 2022-10-24: qty 1

## 2022-10-24 NOTE — Plan of Care (Signed)
Discharge instructions discussed with patient.  Patient instructed on home medications, restrictions, and follow up appointments. Belongings gathered and sent with patient.   Patient discharged via wheelchair by this writer.   
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
## 2022-10-24 NOTE — Evaluation (Signed)
Physical Therapy Evaluation Patient Details Name: Adam Berry MRN: [id number] DOB: 10/01/1955 Today's Date: 10/24/2022  History of Present Illness  Pt is a 67 y/o F admitted on 10/23/22 after presenting with c/o dizziness, diplopia, blurry vision, N&V. MRI showed Punctate acute infarct involving the left paramedian dorsal pons, at  the site of the of the abducens nucleus. PMH: OA, atypical chest pain, PPD positive, diverticulosis, HLD, dysphagia, labile HTN  Clinical Impression  Pt seen for PT evaluation with pt agreeable, pt's aunt & sister in room. Pt reports prior to admission he was independent, driving & working. On this date, pt requires CGA to ambulate to bathroom without AD, close supervision to ambulate with RW. Pt toilets without assistance but is limited overall by dizziness. Unable to assess for nystagmus. Pt keeps R eye closed majority of time, noting diplopia with R eye open & gazing R. PT recommends pt use RW at this time to increase balance with mobility. Will continue to follow pt acutely to address balance & gait with LRAD.       Recommendations for follow up therapy are one component of a multi-disciplinary discharge planning process, led by the attending physician.  Recommendations may be updated based on patient status, additional functional criteria and insurance authorization.  Follow Up Recommendations       Assistance Recommended at Discharge Intermittent Supervision/Assistance  Patient can return home with the following  A little help with walking and/or transfers;A lot of help with bathing/dressing/bathroom;Assistance with cooking/housework;Help with stairs or ramp for entrance    Equipment Recommendations Rolling walker (2 wheels)  Recommendations for Other Services       Functional Status Assessment Patient has had a recent decline in their functional status and demonstrates the ability to make significant improvements in function in a reasonable and  predictable amount of time.     Precautions / Restrictions Precautions Precautions: Fall Restrictions Weight Bearing Restrictions: No      Mobility  Bed Mobility Overal bed mobility: Independent             General bed mobility comments: supine<>sit    Transfers Overall transfer level: Needs assistance Equipment used: None Transfers: Sit to/from Stand Sit to Stand: Supervision, Min guard           General transfer comment: STS from EOB & low toilet in bathroom, CGA fade to supervision    Ambulation/Gait Ambulation/Gait assistance: Min guard, Supervision Gait Distance (Feet): 10 Feet (+ 10 ft) Assistive device: None, Rolling walker (2 wheels) Gait Pattern/deviations: Step-through pattern, Decreased step length - right, Decreased stance time - right, Decreased dorsiflexion - right, Decreased dorsiflexion - left Gait velocity: decreased     General Gait Details: Pt ambulates bed>bathroom without AD with pt intermittently reaching for objects for BUE support. Provided pt with RW for ambulating bathroom>bed for increased stability/balance. PT recommended pt use RW, at least temporarily, to reduce fall risk.  Stairs            Wheelchair Mobility    Modified Rankin (Stroke Patients Only)       Balance Overall balance assessment: Needs assistance   Sitting balance-Leahy Scale: Good     Standing balance support: No upper extremity supported, During functional activity Standing balance-Leahy Scale: Fair                               Pertinent Vitals/Pain Pain Assessment Pain Assessment: No/denies pain    Home  Living Family/patient expects to be discharged to:: Private residence Living Arrangements: Alone Available Help at Discharge: Family;Friend(s) Type of Home: House Home Access: Stairs to enter   [employer] of Steps: 1 at front without rails, 3 at back without rails   Home Layout: One level Home Equipment: None       Prior Function Prior Level of Function : Independent/Modified Independent;Working/employed;Driving                     Hand Dominance        Extremity/Trunk Assessment   Upper Extremity Assessment Upper Extremity Assessment: Overall WFL for tasks assessed    Lower Extremity Assessment Lower Extremity Assessment: Overall WFL for tasks assessed       Communication   Communication: No difficulties  Cognition Arousal/Alertness: Awake/alert Behavior During Therapy: WFL for tasks assessed/performed Overall Cognitive Status: Within Functional Limits for tasks assessed                                          General Comments General comments (skin integrity, edema, etc.): Pt with continent BM on toilet, performing peri hygiene independently. Pt with + emesis after toileting 2/2 dizziness.    Exercises     Assessment/Plan    PT Assessment Patient needs continued PT services  PT Problem List Decreased balance;Decreased knowledge of use of DME;Decreased mobility;Decreased activity tolerance       PT Treatment Interventions DME instruction;Functional mobility training;Balance training;Patient/family education;Gait training;Therapeutic activities;Neuromuscular re-education;Stair training;Therapeutic exercise    PT Goals (Current goals can be found in the Care Plan section)  Acute Rehab PT Goals Patient Stated Goal: get better PT Goal Formulation: With patient Time For Goal Achievement: 11/07/22 Potential to Achieve Goals: Good    Frequency Min 4X/week     Co-evaluation               AM-PAC PT "6 Clicks" Mobility  Outcome Measure Help needed turning from your back to your side while in a flat bed without using bedrails?: None Help needed moving from lying on your back to sitting on the side of a flat bed without using bedrails?: None Help needed moving to and from a bed to a chair (including a wheelchair)?: A Little Help needed  standing up from a chair using your arms (e.g., wheelchair or bedside chair)?: A Little Help needed to walk in hospital room?: A Little Help needed climbing 3-5 steps with a railing? : A Little 6 Click Score: 20    End of Session Equipment Utilized During Treatment: Gait belt Activity Tolerance:  (limited 2/2 dizziness & vomiting) Patient left: in bed;with call bell/phone within reach;with bed alarm set   PT Visit Diagnosis: Unsteadiness on feet (R26.81);Difficulty in walking, not elsewhere classified (R26.2)    Time: 8295-6213 PT Time Calculation (min) (ACUTE ONLY): 29 min   Charges:   PT Evaluation $PT Eval Moderate Complexity: 1 Mod          Aleda Grana, PT, DPT 10/24/22, 9:32 AM   Sandi Mariscal 10/24/2022, 9:31 AM

## 2022-10-24 NOTE — TOC Transition Note (Signed)
Transition of Care [facility]) - CM/SW Discharge Note   Patient Details  Name: Adam Berry MRN: [id number] Date of Birth: [birth year]  [facility] CM/SW Contact:  Kermit Balo, RN Phone Number: 10/24/2022, 1:41 PM   Clinical Narrative:    Pt is discharging home with home health services through [facility]. Information on the AVS.  Walker ordered through [facility] and will be delivered to the room. Pt has family that will assist with transportation.  No DME needs.  Pt wasn't taking any medications prior to admission. Pt feels he can be compliant with meds after d/c.  Pt has transport home when medically ready for d/c.    Final next level of care: Home w Home Health Services Barriers to Discharge: No Barriers Identified   Patient Goals and CMS Choice CMS Medicare.gov Compare Post Acute Care list provided to:: Patient Choice offered to / list presented to : Patient  Discharge Placement                         Discharge Plan and Services Additional resources added to the After Visit Summary for                  DME Arranged: Walker rolling DME Agency: [facility] Date DME Agency Contacted: 10/24/22   Representative spoke with at DME Agency: Barbara Cower HH Arranged: PT, OT [facility] Agency: [facility] Date HH Agency Contacted: 10/24/22   Representative spoke with at [facility] Agency: Morrie Sheldon  Social Determinants of Health (SDOH) Interventions SDOH Screenings   Depression (PHQ2-9): Low Risk  (05/12/2022)  Tobacco Use: Low Risk  (10/23/2022)     Readmission Risk Interventions     No data to display

## 2022-10-24 NOTE — Progress Notes (Signed)
Mobility Specialist Progress Note   10/24/22 1415  Mobility  Activity Ambulated with assistance in hallway  Level of Assistance Contact guard assist, steadying assist  Assistive Device Front wheel walker  Distance Ambulated (ft) 378 ft  Activity Response Tolerated well  Mobility Referral Yes  $Mobility charge 1 Mobility   Received pt in bed w/o complaint and agreeable. Pt ambulating in hallway w/ a steady gait. Emphasized slow movements and focusing gaze while ambulating and turning this session. Pt adhering well. Slight dizziness mid session but pt claimed it subsided quickly. Returned back to room w/o fault, call bell in reach and needs met.   Frederico Hamman Mobility Specialist Please contact via SecureChat or  Rehab office at [phone]

## 2022-10-24 NOTE — Progress Notes (Signed)
30 day monitor for evaluation of CVA  To be read by Dr. Carolan Clines (per Rosann Auerbach)

## 2022-10-24 NOTE — ED Notes (Signed)
Pt completed swallow evaluation without any problems of dysphagia. Tolerated well. No s/s distress observed.
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
## 2022-10-24 NOTE — Discharge Summary (Signed)
Physician Discharge Summary   Patient: Adam Berry MRN: [id number] DOB: [birth year]  Admit date:     10/23/2022  Discharge date: 10/24/22  Discharge Physician: Alberteen Sam   PCP: Lucky Cowboy, MD     Recommendations at discharge:  Follow up with [facility] in 1 month for stroke Follow up with Dr. Oneta Rack for cholesterol management on new dose Crestor Patient discharged with Cardiac monitor for 1 month     Discharge Diagnoses: Principal Problem:   Acute left pontine cerebrovascular accident Active Problems:   Hyperlipidemia, mixed   Prediabetes   Hypophosphatemia   Hypomagnesemia   Hypokalemia     Hospital Course: Adam Berry is a 67 y.o. M with hyperlipidemia who presented with acute double vision, vertigo and nausea.  In the ER, patient had abnormal right eye abduction.    Acute pontine infarct Admitted and MRI showed acute left pontine infarct.   -Non-invasive angiography showed no large vessel disease -Echocardiogram showed no cardiogenic source of embolism -Carotid imaging unremarkable   -Lipids ordered: discharged on increased dose of Crestor 20 daily -Aspirin ordered at admission --> discharged on aspirin and Plavix for 3 weeks, then aspirin alone indefinitely -Atrial fibrillation: had very brief, maybe 5-10 beats narrow complex tachyarrhythmias noted, but no clear Afib --> Recommend cardiac monitoring at discharge, referral sent -tPA not given because symptoms mild -Dysphagia screen ordered in ER -PT eval ordered: recommended HHPT -Nonsmoker    Hyperlipidemia LDL 125. Crestor increased to 20 mg daily.           [facility] Controlled Substances Registry was reviewed for this patient prior to discharge.  Consultants: Neurology Procedures performed:  MRI brain Echo CTA head and neck   Disposition: Home Diet recommendation:  Discharge Diet Orders (From admission, onward)     Start      Ordered   10/24/22 0000  Diet - low sodium heart healthy        10/24/22 1601             DISCHARGE MEDICATION: Allergies as of 10/24/2022       Reactions   Ppd [tuberculin Purified Protein Derivative] Other (See Comments)   Hx/o strong reaction in childhood        Medication List     STOP taking these medications    ibuprofen 200 MG tablet Commonly known as: ADVIL       TAKE these medications    ascorbic acid 500 MG tablet Commonly known as: VITAMIN C Take 500 mg by mouth daily.   aspirin 81 MG tablet Take 81 mg by mouth daily.   clopidogrel 75 MG tablet Commonly known as: PLAVIX Take 1 tablet (75 mg total) by mouth daily. Start taking on: October 25, 2022   multivitamin tablet Take 1 tablet by mouth daily.   rosuvastatin 20 MG tablet Commonly known as: CRESTOR Take 1 tablet (20 mg total) by mouth daily. Start taking on: October 25, 2022 What changed:  medication strength how much to take how to take this when to take this additional instructions   UNABLE TO FIND Take 15 mLs by mouth daily. Med Name: Blackseed oil   Vitamin D3 125 MCG (5000 UT) Caps Take 10,000 Units by mouth daily.   Zinc 50 MG Tabs Take 50 mg by mouth daily.               Durable Medical Equipment  (From admission, onward)  Start     Ordered   10/24/22 1009  For home use only DME Walker rolling  Once       Question Answer Comment  Walker: With 5 Inch Wheels   Patient needs a walker to treat with the following condition General weakness      10/24/22 1008            Follow-up Information     [facility] Follow up.   Why: The home health agency will contact you for the first home visit. Contact information: [phone]        Lucky Cowboy, MD. Schedule an appointment as soon as possible for a visit in 1 week(s).   Specialty: Internal Medicine Contact information: [address] [place]  [address] [phone]         [facility] Follow up in 1 month(s).   Contact information: [address]     [address] [place] [address] [phone]                Discharge Instructions     Ambulatory referral to Neurology   Complete by: As directed    An appointment is requested in approximately: 4 weeks For stroke   Diet - low sodium heart healthy   Complete by: As directed    Discharge instructions   Complete by: As directed    **IMPORTANT DISCHARGE INSTRUCTIONS**   From Dr. Maryfrances Bunnell: You were admitted for a small stroke This stroke was in the "pons" near a collection of nerves that control right eye movements.  Hopefully, these symptoms will gradually improve  To prevent more strokes: Continue your low dose aspirin 81 mg daily from now on, as you have been FOR THE NEXT THREE WEEKS: add the super aspirin called clopidogrel/Plavix  Take clopidogrel 75 mg once daily for 3 weeks then stop  INCREASE your cholesterol medicine to 20 mg nightly  We have asked the Cardiology (Heart) service to send you a heart monitor to wear for the next month.  This is the last part of your testing to understand the cause of this stroke.  It will come in the mail and include instructions on how to place the monitor and what to do with the results. Go see Dr. Oneta Rack in 1 week for a post-hospital follow up  Go see the Neurologists (at [facility]) in 4-6 weeks  My office phone number is [phone] Explain that you need FMLA paperwork filled out, and Dr. Maryfrances Bunnell said he would handle it; they will contact me   Increase activity slowly   Complete by: As directed        Discharge Exam: Filed Weights   10/23/22 0534  Weight: 93 kg    General: Pt is alert, awake, not in acute distress Cardiovascular: RRR, nl S1-S2, no murmurs appreciated.   No LE edema.   Respiratory: Normal respiratory rate and rhythm.  CTAB  without rales or wheezes. Abdominal: Abdomen soft and non-tender.  No distension or HSM.   Neuro/Psych: Strength symmetric in upper and lower extremities.  Judgment and insight appear normal.  Right eye abduction weakness, toehrwise nonfocal.   Condition at discharge: good  The results of significant diagnostics from this hospitalization (including imaging, microbiology, ancillary and laboratory) are listed below for reference.   Imaging Studies: MR BRAIN WO CONTRAST  Result Date: 10/23/2022 CLINICAL DATA:  Neuro deficit, acute, stroke suspected EXAM: MRI HEAD WITHOUT CONTRAST TECHNIQUE: Multiplanar, multiecho pulse sequences  of the brain and surrounding structures were obtained without intravenous contrast. COMPARISON:  Same day CT head. FINDINGS: Brain: Punctate acute infarct involving the left paramedian dorsal pons. No acute hemorrhage, hydrocephalus, extra-axial collection or mass lesion. Small remote perforator infarct in the right basal ganglia. Mild for age additional T2/FLAIR hyperintensities white matter, compatible with mild chronic microvascular ischemic disease. Vascular: Major arterial flow voids are maintained. Skull and upper cervical spine: Normal marrow signal. Sinuses/Orbits: Negative. Other: No mastoid effusions. IMPRESSION: Punctate acute infarct involving the left paramedian dorsal pons, at the site of the of the abducens nucleus. Electronically Signed   By: Feliberto Harts M.D.   On: 10/23/2022 12:27   CT ANGIO HEAD NECK W WO CM  Result Date: 10/23/2022 CLINICAL DATA:  67 year old male code stroke presentation this morning. EXAM: CT ANGIOGRAPHY HEAD AND NECK WITH AND WITHOUT CONTRAST TECHNIQUE: Multidetector CT imaging of the head and neck was performed using the standard protocol during bolus administration of intravenous contrast. Multiplanar CT image reconstructions and MIPs were obtained to evaluate the vascular anatomy. Carotid stenosis measurements (when applicable) are  obtained utilizing NASCET criteria, using the distal internal carotid diameter as the denominator. RADIATION DOSE REDUCTION: This exam was performed according to the departmental dose-optimization program which includes automated exposure control, adjustment of the mA and/or kV according to patient size and/or use of iterative reconstruction technique. CONTRAST:  75mL OMNIPAQUE IOHEXOL 350 MG/ML SOLN COMPARISON:  Plain head CT 0545 hours today. FINDINGS: CTA NECK Skeleton: Widespread cervical spine degeneration. No acute osseous abnormality identified. Upper chest: Negative. Other neck: No acute finding. Aortic arch: 3 vessel arch configuration. Generalized proximal great vessel tortuosity. Right carotid system: Tortuous brachiocephalic artery with no plaque or stenosis. Mildly tortuous right CCA. Patent right carotid bifurcation. Mildly tortuous right ICA distal to the bulb. No significant plaque or stenosis. Left carotid system: Similar tortuosity with no significant plaque or stenosis. Vertebral arteries: Minor plaque at the right subclavian artery origin. Mildly tortuous proximal right subclavian. Normal right vertebral artery origin. Dominant right vertebral artery is widely patent to the skull base. Proximal left subclavian artery is tortuous with a kinked appearance in the superior mediastinum. No significant plaque. Diminutive left vertebral artery origin is within normal limits. The left vertebral remains diminutive but patent to the skull base with no focal stenosis identified. CTA HEAD Posterior circulation: Dominant right vertebral artery is tortuous and primarily supplies the basilar. There is a small distal left V4 segment beyond the normal left PICA origin. Normal right PICA. Tortuous, patent basilar artery without stenosis. Patent SCA and PCA origins. Bilateral posterior communicating arteries are mildly tortuous. Bilateral PCA branches are mildly tortuous and within normal limits. Anterior  circulation: Both ICA siphons are patent. Both ICAs are tortuous, mildly ectatic with no significant plaque or stenosis. Bilateral ophthalmic and posterior communicating artery origins are normal. Patent carotid termini. Normal MCA and ACA origins. Tortuous A1 segments, the left is mildly dominant. Anterior communicating artery and bilateral ACA branches are within normal limits. Both MCA M1 segments are tortuous, patent without stenosis. Left MCA bifurcation and right MCA trifurcation are patent without stenosis. Bilateral MCA branches are within normal limits. Venous sinuses: Patent. Anatomic variants: Dominant right vertebral artery which primarily supplies the basilar. Review of the MIP images confirms the above findings IMPRESSION: 1. Negative for large vessel occlusion. Minimal atherosclerosis. Generalized arterial tortuosity in the head and neck with no significant stenosis. 2. Advanced cervical spine degeneration. Electronically Signed   By: Odessa Fleming  M.D.   On: 10/23/2022 07:17   CT HEAD CODE STROKE WO CONTRAST  Result Date: 10/23/2022 CLINICAL DATA:  Code stroke. 67 year old male with dizziness, abnormal vision. Neurologic deficit. EXAM: CT HEAD WITHOUT CONTRAST TECHNIQUE: Contiguous axial images were obtained from the base of the skull through the vertex without intravenous contrast. RADIATION DOSE REDUCTION: This exam was performed according to the departmental dose-optimization program which includes automated exposure control, adjustment of the mA and/or kV according to patient size and/or use of iterative reconstruction technique. COMPARISON:  None Available. FINDINGS: Brain: Small chronic lacunar infarct of the right caudate nucleus series 4, image 15. Background cerebral volume within normal limits for age. No midline shift, ventriculomegaly, mass effect, evidence of mass lesion, intracranial hemorrhage or evidence of cortically based acute infarction. Outside of the right caudate gray-white  differentiation is within normal limits. No other encephalomalacia identified. Vascular: Mild Calcified atherosclerosis at the skull base. Dominant appearing distal right vertebral artery. Some generalized intracranial artery tortuosity. No suspicious intracranial vascular hyperdensity. Skull: No acute osseous abnormality identified. Sinuses/Orbits: Minor paranasal sinus mucosal thickening. Tympanic cavities and mastoids appear well aerated. Other: No gaze deviation. Visualized orbits and scalp soft tissues are within normal limits. ASPECTS [facility] Stroke Program Early CT Score) Total score (0-10 with 10 being normal): 10 IMPRESSION: 1. No acute cortically based infarct or acute intracranial hemorrhage identified. ASPECTS 10. 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 2. Study discussed by telephone with Dr. Dione Booze on 10/23/2022 at 06:02 . Electronically Signed   By: Odessa Fleming M.D.   On: 10/23/2022 06:03    Microbiology: No results found for this or any previous visit.  Labs: CBC: Recent Labs  Lab 10/23/22 0545 10/23/22 0548 10/24/22 0436  WBC 5.5  --  9.0  NEUTROABS 3.4  --   --   HGB 13.1 14.3 12.3*  HCT 41.0 42.0 37.7*  MCV 85.2  --  84.2  PLT 297  --  276   Basic Metabolic Panel: Recent Labs  Lab 10/23/22 0545 10/23/22 0548 10/23/22 0549 10/23/22 0835 10/24/22 0436  NA 137 141  --   --  140  K 3.1* 3.3*  --   --  3.8  CL 103 102  --   --  104  CO2 26  --   --   --  27  GLUCOSE 175* 172*  --   --  92  BUN 12 10  --   --  8  CREATININE 1.11 1.00  --   --  1.08  CALCIUM 8.9  --   --   --  9.0  MG  --   --  1.9 1.6*  --   PHOS  --   --   --  1.6*  --    Liver Function Tests: Recent Labs  Lab 10/23/22 0545 10/24/22 0436  AST 17 13*  ALT 16 13  ALKPHOS 64 61  BILITOT 0.6 0.6  PROT 7.4 6.1*  ALBUMIN 3.9 3.2*   CBG: Recent Labs  Lab 10/23/22 1153 10/23/22 1742 10/23/22 2124 10/24/22 0615 10/24/22 1335  GLUCAP 118* 125* 124* 77 115*     Discharge time spent: approximately 35 minutes spent on discharge counseling, evaluation of patient on day of discharge, and coordination of discharge planning with nursing, social work, pharmacy and case management  Signed: Alberteen Sam, MD [facility] 10/24/2022

## 2022-10-27 ENCOUNTER — Other Ambulatory Visit: Payer: Self-pay | Admitting: Cardiology

## 2022-10-27 DIAGNOSIS — I639 Cerebral infarction, unspecified: Secondary | ICD-10-CM

## 2022-10-27 DIAGNOSIS — R42 Dizziness and giddiness: Secondary | ICD-10-CM

## 2022-10-27 LAB — GLUCOSE, CAPILLARY: Glucose-Capillary: 102 mg/dL — ABNORMAL HIGH (ref 70–99)

## 2022-10-29 ENCOUNTER — Ambulatory Visit (INDEPENDENT_AMBULATORY_CARE_PROVIDER_SITE_OTHER): Payer: BC Managed Care – PPO | Admitting: Nurse Practitioner

## 2022-10-29 ENCOUNTER — Encounter: Payer: Self-pay | Admitting: Nurse Practitioner

## 2022-10-29 VITALS — BP 116/80 | HR 73 | Temp 97.9°F | Ht 72.0 in | Wt 203.2 lb

## 2022-10-29 DIAGNOSIS — Z09 Encounter for follow-up examination after completed treatment for conditions other than malignant neoplasm: Secondary | ICD-10-CM

## 2022-10-29 DIAGNOSIS — I4891 Unspecified atrial fibrillation: Secondary | ICD-10-CM

## 2022-10-29 DIAGNOSIS — H539 Unspecified visual disturbance: Secondary | ICD-10-CM

## 2022-10-29 DIAGNOSIS — I639 Cerebral infarction, unspecified: Secondary | ICD-10-CM | POA: Diagnosis not present

## 2022-10-29 DIAGNOSIS — E782 Mixed hyperlipidemia: Secondary | ICD-10-CM

## 2022-10-29 DIAGNOSIS — I4892 Unspecified atrial flutter: Secondary | ICD-10-CM

## 2022-10-29 DIAGNOSIS — Z79899 Other long term (current) drug therapy: Secondary | ICD-10-CM

## 2022-10-29 NOTE — Patient Instructions (Signed)
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
## 2022-10-30 NOTE — Addendum Note (Signed)
Addended by: Adela Glimpse on: 10/30/2022 09:37 AM   Modules accepted: Orders

## 2022-11-04 ENCOUNTER — Ambulatory Visit: Payer: BC Managed Care – PPO | Admitting: Internal Medicine

## 2022-11-05 ENCOUNTER — Encounter: Payer: Self-pay | Admitting: Nurse Practitioner

## 2022-11-05 ENCOUNTER — Ambulatory Visit (INDEPENDENT_AMBULATORY_CARE_PROVIDER_SITE_OTHER): Payer: BC Managed Care – PPO | Admitting: Nurse Practitioner

## 2022-11-05 VITALS — BP 110/80 | HR 78 | Temp 98.1°F | Ht 72.0 in | Wt 201.2 lb

## 2022-11-05 DIAGNOSIS — Z0271 Encounter for disability determination: Secondary | ICD-10-CM | POA: Diagnosis not present
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
## 2022-11-05 NOTE — Patient Instructions (Signed)
Cognitive Rehabilitation After a Stroke A stroke happens when there is not enough blood flow to your brain. It can affect your thinking and memory. Rehabilitation, also called rehab, can help improve these areas. It can help with your ability to focus (attention span), make decisions, and recall information. It also can help with language and perception. Rehab can help to improve your quality of life. What is cognitive rehab? Cognitive rehab is a program to help you improve your thinking skills after a stroke. It can help with memory, problem-solving, and communication. Therapy focuses on: Improving brain function. You may learn to break down tasks into simple steps. Coping with thinking problems. You may learn ways to help improve your memory. You may also do activities to help your memory. These may include naming objects or describing pictures. Types of rehab Cognitive rehab refers to a group of therapies that are done by a specialist. These may include: Speech-language therapy. This can help you understand and communicate better. Occupational therapy. This can help you with daily activities. Music therapy. This can help with stress, anxiety, and depression. It may involve listening to music, singing, or playing instruments. Physical therapy. This can help you get stronger and move better. These therapies may involve: Helping you learn ways to cope with memory problems. This may include setting alarms to remind you to take medicines. You also may learn new techniques to help your memory. Using virtual reality or video games to help you remember things like words and the names of objects. Exercise to help send more blood to your brain. This information is not intended to replace advice given to you by your health care provider. Make sure you discuss any questions you have with your health care provider. Document Revised: 03/27/2022 Document Reviewed: 11/28/2021 Elsevier Patient Education  2023  Elsevier Inc.  

## 2022-11-06 DIAGNOSIS — I639 Cerebral infarction, unspecified: Secondary | ICD-10-CM

## 2022-11-06 DIAGNOSIS — R42 Dizziness and giddiness: Secondary | ICD-10-CM | POA: Diagnosis not present

## 2022-11-19 ENCOUNTER — Ambulatory Visit (INDEPENDENT_AMBULATORY_CARE_PROVIDER_SITE_OTHER): Payer: BC Managed Care – PPO | Admitting: Internal Medicine

## 2022-11-19 ENCOUNTER — Encounter: Payer: Self-pay | Admitting: Internal Medicine

## 2022-11-19 ENCOUNTER — Other Ambulatory Visit: Payer: Self-pay

## 2022-11-19 VITALS — BP 124/78 | HR 70 | Temp 97.8°F | Resp 16 | Ht 72.0 in | Wt 203.4 lb

## 2022-11-19 DIAGNOSIS — E782 Mixed hyperlipidemia: Secondary | ICD-10-CM | POA: Diagnosis not present

## 2022-11-19 DIAGNOSIS — E559 Vitamin D deficiency, unspecified: Secondary | ICD-10-CM | POA: Diagnosis not present

## 2022-11-19 DIAGNOSIS — Z79899 Other long term (current) drug therapy: Secondary | ICD-10-CM | POA: Diagnosis not present

## 2022-11-19 DIAGNOSIS — R7309 Other abnormal glucose: Secondary | ICD-10-CM | POA: Diagnosis not present

## 2022-11-19 DIAGNOSIS — R0989 Other specified symptoms and signs involving the circulatory and respiratory systems: Secondary | ICD-10-CM

## 2022-11-19 LAB — CBC WITH DIFFERENTIAL/PLATELET
Absolute Monocytes: 334 cells/uL (ref 200–950)
Hemoglobin: 13.8 g/dL (ref 13.2–17.1)
MCH: 27.3 pg (ref 27.0–33.0)
MCV: 84.8 fL (ref 80.0–100.0)
MPV: 9.5 fL (ref 7.5–12.5)
Monocytes Relative: 7.1 %
Total Lymphocyte: 30.5 %

## 2022-11-19 MED ORDER — ROSUVASTATIN CALCIUM 20 MG PO TABS: 20.0000 mg | ORAL_TABLET | Freq: Every day | ORAL | 3 refills | Status: DC
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
## 2022-11-19 NOTE — Patient Instructions (Signed)

## 2022-11-20 LAB — CBC WITH DIFFERENTIAL/PLATELET
Basophils Absolute: 19 cells/uL (ref 0–200)
Basophils Relative: 0.4 %
Eosinophils Absolute: 324 cells/uL (ref 15–500)
Eosinophils Relative: 6.9 %
HCT: 42.9 % (ref 38.5–50.0)
Lymphs Abs: 1434 cells/uL (ref 850–3900)
MCHC: 32.2 g/dL (ref 32.0–36.0)
Neutro Abs: 2590 cells/uL (ref 1500–7800)
Neutrophils Relative %: 55.1 %
Platelets: 299 10*3/uL (ref 140–400)
RBC: 5.06 10*6/uL (ref 4.20–5.80)
RDW: 13.1 % (ref 11.0–15.0)
WBC: 4.7 10*3/uL (ref 3.8–10.8)

## 2022-11-20 LAB — LIPID PANEL
Cholesterol: 133 mg/dL (ref <200)
HDL: 47 mg/dL (ref >=40)
LDL Cholesterol (Calc): 67 mg/dL (calc)
Non-HDL Cholesterol (Calc): 86 mg/dL (calc) (ref <130)
Total CHOL/HDL Ratio: 2.8 (calc) (ref <5.0)
Triglycerides: 100 mg/dL (ref <150)

## 2022-11-20 LAB — HEMOGLOBIN A1C
Hgb A1c MFr Bld: 6 % of total Hgb — ABNORMAL HIGH (ref <5.7)
Mean Plasma Glucose: 126 mg/dL
eAG (mmol/L): 7 mmol/L

## 2022-11-20 LAB — COMPLETE METABOLIC PANEL WITH GFR
AG Ratio: 1.4 (calc) (ref 1.0–2.5)
ALT: 16 U/L (ref 9–46)
AST: 16 U/L (ref 10–35)
Albumin: 4.4 g/dL (ref 3.6–5.1)
Alkaline phosphatase (APISO): 80 U/L (ref 35–144)
BUN: 13 mg/dL (ref 7–25)
CO2: 30 mmol/L (ref 20–32)
Calcium: 10.1 mg/dL (ref 8.6–10.3)
Chloride: 103 mmol/L (ref 98–110)
Creat: 1 mg/dL (ref 0.70–1.35)
Globulin: 3.1 g/dL (calc) (ref 1.9–3.7)
Glucose, Bld: 94 mg/dL (ref 65–99)
Potassium: 3.9 mmol/L (ref 3.5–5.3)
Sodium: 140 mmol/L (ref 135–146)
Total Bilirubin: 0.3 mg/dL (ref 0.2–1.2)
Total Protein: 7.5 g/dL (ref 6.1–8.1)
eGFR: 83 mL/min/{1.73_m2} (ref >=60)

## 2022-11-20 LAB — INSULIN, RANDOM: Insulin: 54.4 u[IU]/mL — ABNORMAL HIGH

## 2022-11-20 LAB — MAGNESIUM: Magnesium: 2.1 mg/dL (ref 1.5–2.5)

## 2022-11-20 LAB — TSH: TSH: 0.88 mIU/L (ref 0.40–4.50)

## 2022-11-20 LAB — VITAMIN D 25 HYDROXY (VIT D DEFICIENCY, FRACTURES): Vit D, 25-Hydroxy: 39 ng/mL (ref 30–100)

## 2022-11-21 ENCOUNTER — Other Ambulatory Visit: Payer: Self-pay | Admitting: Internal Medicine

## 2022-11-21 NOTE — Progress Notes (Signed)
^<^<^<^<^<^<^<^<^<^<^<^<^<^<^<^<^<^<^<^<^<^<^<^<^<^<^<^<^<^<^<^<^<^<^<^<^ ^>^>^>^>^>^>^>^>^>^>^>>^>^>^>^>^>^>^>^>^>^>^>^>^>^>^>^>^>^>^>^>^>^>^>^>^>  -Test results slightly outside the reference range are not unusual. If there is anything important, I will review this with you,  otherwise it is considered normal test values.  If you have further questions,  please do not hesitate to contact me at the office or via My Chart.   ^<^<^<^<^<^<^<^<^<^<^<^<^<^<^<^<^<^<^<^<^<^<^<^<^<^<^<^<^<^<^<^<^<^<^<^<^ ^>^>^>^>^>^>^>^>^>^>^>^>^>^>^>^>^>^>^>^>^>^>^>^>^>^>^>^>^>^>^>^>^>^>^>^>^  - A1c is up to 6.0%  which meams  Blood sugar and A1c are   elevated in the borderline and early or pre-diabetes range which has the same   300% increased risk for heart attack, stroke, cancer and                                   alzheimer- type vascular dementia as full blown diabetes.   But the good news is that diet, exercise with weight loss can                                                                          cure the early diabetes at this point. ^>^>^>^>^>^>^>^>^>^>^>^>^>^>^>^>^>^>^>^>^>^>^>^>^>^>^>^>^>^>^>^>^>^>^>^>^ ^>^>^>^>^>^>^>^>^>^>^>^>^>^>^>^>^>^>^>^>^>^>^>^>^>^>^>^>^>^>^>^>^>^>^>^>^  - So    - Avoid Sweets, Candy & White Stuff   - White Rice, [place], White Flour  - Breads &  Pasta ^>^>^>^>^>^>^>^>^>^>^>^>^>^>^>^>^>^>^>^>^>^>^>^>^>^>^>^>^>^>^>^>^>^>^>^>^ ^>^>^>^>^>^>^>^>^>^>^>^>^>^>^>^>^>^>^>^>^>^>^>^>^>^>^>^>^>^>^>^>^>^>^>^>^  - Vitamin D = 39 - is still too low  !   - Vitamin D goal is between 70-100.   - Please make sure that you are taking your                                                            Vitamin D 10,000 units as recommended   - It is very important as a natural anti-inflammatory and helping the                               immune system protect against viral infections, like the Covid-19    helping hair, skin, and nails, as well as reducing stroke andheart  attack risk.   - It helps your bones and helps with mood.  - It also decreases numerous cancer risks so please  take it as directed.   - Low Vit D is associated with a 200-300% higher risk for  CANCER   and 200-300% higher risk for HEART   ATTACK  &  STROKE.    - It is also associated with higher death rate at younger ages,   autoimmune diseases like Rheumatoid arthritis, Lupus,  ^>^>^>^>^>^>^>^>^>^>^>^>^>^>^>^>^>^>^>^>^>^>^>^>^>^>^>^>^>^>^>^>^>^>^>^>^  -  Chol = 133  - Excellent !   . ^>^>^>^>^>^>^>^>^>^>^>^>^>^>^>^>^>^>^>^>^>^>^>^>^>^>^>^>^>^>^>^>^>^>^>^>^  - All Else - CBC - Kidneys - Electrolytes - Liver - Magnesium & Thyroid    - all  Normal / OK ^>^>^>^>^>^>^>^>^>^>^>^>^>^>^>^>^>^>^>^>^>^>^>^>^>^>^>^>^>^>^>^>^>^>^>^>^ ^>^>^>^>^>^>^>^>^>^>^>^>^>^>^>^>^>^>^>^>^>^>^>^>^>^>^>^>^>^>^>^>^>^>^>^>^  -  -                           Multiple Sclerosis.     - Also many other serious conditions,  like depression, Alzheimer's  Dementia, infertility, muscle aches, fatigue, fibromyalgia   - just to name a few. ==========================================================

## 2022-12-08 ENCOUNTER — Encounter: Payer: Self-pay | Admitting: Cardiology

## 2022-12-08 ENCOUNTER — Ambulatory Visit: Payer: BC Managed Care – PPO | Attending: Internal Medicine | Admitting: Cardiology

## 2022-12-08 ENCOUNTER — Ambulatory Visit: Payer: BC Managed Care – PPO | Admitting: Internal Medicine

## 2022-12-08 VITALS — BP 132/82 | HR 65 | Ht 73.0 in | Wt 206.0 lb

## 2022-12-08 DIAGNOSIS — E785 Hyperlipidemia, unspecified: Secondary | ICD-10-CM | POA: Diagnosis not present

## 2022-12-08 DIAGNOSIS — Z8249 Family history of ischemic heart disease and other diseases of the circulatory system: Secondary | ICD-10-CM | POA: Diagnosis not present

## 2022-12-08 DIAGNOSIS — I639 Cerebral infarction, unspecified: Secondary | ICD-10-CM

## 2022-12-08 NOTE — Patient Instructions (Signed)
Medication Instructions:  The current medical regimen is effective;  continue present plan and medications.  *If you need a refill on your cardiac medications before your next appointment, please call your pharmacy*   Lab Work: LIPID (in 2 months)  If you have labs (blood work) drawn today and your tests are completely normal, you will receive your results only by: MyChart Message (if you have MyChart) OR A paper copy in the mail If you have any lab test that is abnormal or we need to change your treatment, we will call you to review the results.   Testing/Procedures:  Test locations:  [employer] [place]  [facility]  This is $99 out of pocket.   Coronary CalciumScan A coronary calcium scan is an imaging test used to look for deposits of calcium and other fatty materials (plaques) in the inner lining of the blood vessels of the heart (coronary arteries). These deposits of calcium and plaques can partly clog and narrow the coronary arteries without producing any symptoms or warning signs. This puts a person at risk for a heart attack. This test can detect these deposits before symptoms develop. Tell a health care provider about: Any allergies you have. All medicines you are taking, including vitamins, herbs, eye drops, creams, and over-the-counter medicines. Any problems you or family members have had with anesthetic medicines. Any blood disorders you have. Any surgeries you have had. Any medical conditions you have. Whether you are pregnant or may be pregnant. What are the risks? Generally, this is a safe procedure. However, problems may occur, including: Harm to a pregnant woman and her unborn baby. This test involves the use of radiation. Radiation exposure can be dangerous to a pregnant woman and her unborn baby. If you are pregnant, you generally should not have this procedure done. Slight  increase in the risk of cancer. This is because of the radiation involved in the test. What happens before the procedure? No preparation is needed for this procedure. What happens during the procedure? You will undress and remove any jewelry around your neck or chest. You will put on a hospital gown. Sticky electrodes will be placed on your chest. The electrodes will be connected to an electrocardiogram (ECG) machine to record a tracing of the electrical activity of your heart. A CT scanner will take pictures of your heart. During this time, you will be asked to lie still and hold your breath for 2-3 seconds while a picture of your heart is being taken. The procedure may vary among health care providers and hospitals. What happens after the procedure? You can get dressed. You can return to your normal activities. It is up to you to get the results of your test. Ask your health care provider, or the department that is doing the test, when your results will be ready. Summary A coronary calcium scan is an imaging test used to look for deposits of calcium and other fatty materials (plaques) in the inner lining of the blood vessels of the heart (coronary arteries). Generally, this is a safe procedure. Tell your health care provider if you are pregnant or may be pregnant. No preparation is needed for this procedure. A CT scanner will take pictures of your heart. You can return to your normal activities after the scan is done. This information is not intended to replace advice given to you by your health care provider. Make sure you discuss any questions  you have with your health care provider. Document Released: 12/20/2007 Document Revised: 05/12/2016 Document Reviewed: 05/12/2016 Elsevier Interactive Patient Education  2017 [employer].    Follow-Up: At [facility], you and your health needs are our priority.  As part of our continuing mission to provide you with exceptional heart  care, we have created designated Provider Care Teams.  These Care Teams include your primary Cardiologist (physician) and Advanced Practice Providers (APPs -  Physician Assistants and Nurse Practitioners) who all work together to provide you with the care you need, when you need it.  We recommend signing up for the patient portal called "MyChart".  Sign up information is provided on this After Visit Summary.  MyChart is used to connect with patients for Virtual Visits (Telemedicine).  Patients are able to view lab/test results, encounter notes, upcoming appointments, etc.  Non-urgent messages can be sent to your provider as well.   To learn more about what you can do with MyChart, go to [email or web address].    Your next appointment:   6 month(s)  Provider:   Epifanio Lesches, MD
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
## 2022-12-09 ENCOUNTER — Ambulatory Visit: Payer: BC Managed Care – PPO | Attending: Internal Medicine

## 2022-12-09 DIAGNOSIS — I639 Cerebral infarction, unspecified: Secondary | ICD-10-CM

## 2022-12-09 DIAGNOSIS — R42 Dizziness and giddiness: Secondary | ICD-10-CM

## 2022-12-19 ENCOUNTER — Encounter: Payer: Self-pay | Admitting: Nurse Practitioner

## 2022-12-19 ENCOUNTER — Ambulatory Visit (INDEPENDENT_AMBULATORY_CARE_PROVIDER_SITE_OTHER): Payer: BC Managed Care – PPO | Admitting: Nurse Practitioner

## 2022-12-19 VITALS — BP 110/78 | HR 68 | Temp 98.0°F | Ht 73.0 in | Wt 206.2 lb

## 2022-12-19 DIAGNOSIS — Z029 Encounter for administrative examinations, unspecified: Secondary | ICD-10-CM

## 2022-12-19 DIAGNOSIS — Z8673 Personal history of transient ischemic attack (TIA), and cerebral infarction without residual deficits: Secondary | ICD-10-CM | POA: Diagnosis not present
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
## 2022-12-19 NOTE — Patient Instructions (Signed)

## 2023-01-05 ENCOUNTER — Ambulatory Visit (HOSPITAL_BASED_OUTPATIENT_CLINIC_OR_DEPARTMENT_OTHER): Payer: BC Managed Care – PPO

## 2023-01-12 ENCOUNTER — Ambulatory Visit (HOSPITAL_BASED_OUTPATIENT_CLINIC_OR_DEPARTMENT_OTHER)
Admission: RE | Admit: 2023-01-12 | Discharge: 2023-01-12 | Disposition: A | Discharge status: Home / Self Care | Payer: BC Managed Care – PPO | Source: Ambulatory Visit | Attending: Cardiology | Admitting: Cardiology

## 2023-01-12 DIAGNOSIS — Z8249 Family history of ischemic heart disease and other diseases of the circulatory system: Secondary | ICD-10-CM | POA: Insufficient documentation
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
## 2023-01-13 NOTE — Patient Instructions (Signed)

## 2023-01-14 ENCOUNTER — Other Ambulatory Visit: Payer: Self-pay

## 2023-01-14 ENCOUNTER — Encounter: Payer: Self-pay | Admitting: Family Medicine

## 2023-01-14 ENCOUNTER — Inpatient Hospital Stay: Payer: BC Managed Care – PPO | Admitting: Neurology

## 2023-01-14 ENCOUNTER — Ambulatory Visit (INDEPENDENT_AMBULATORY_CARE_PROVIDER_SITE_OTHER): Payer: BC Managed Care – PPO | Admitting: Family Medicine

## 2023-01-14 VITALS — BP 127/83 | HR 71 | Ht 74.0 in | Wt 207.0 lb

## 2023-01-14 DIAGNOSIS — I77819 Aortic ectasia, unspecified site: Secondary | ICD-10-CM

## 2023-01-14 DIAGNOSIS — I639 Cerebral infarction, unspecified: Secondary | ICD-10-CM

## 2023-01-15 ENCOUNTER — Telehealth: Payer: Self-pay | Admitting: Nurse Practitioner

## 2023-01-15 NOTE — Telephone Encounter (Signed)
Note completed and up front for pick up.

## 2023-01-15 NOTE — Telephone Encounter (Signed)
PT WAS SEEN IN JUNE BY TONYA AND HE NEEDS A NOTE SAYING HE CAN GO BACK TO WORK ON MONDAY 01-19-2023 AND HAVE NO RESTRICTIONS-CAN CALL PT BACK AT [phone].

## 2023-01-19 ENCOUNTER — Other Ambulatory Visit: Payer: Self-pay | Admitting: *Deleted

## 2023-01-19 DIAGNOSIS — R911 Solitary pulmonary nodule: Secondary | ICD-10-CM

## 2023-02-24 ENCOUNTER — Telehealth: Payer: Self-pay

## 2023-02-24 NOTE — Telephone Encounter (Signed)
Called and left message asking pt to come in for appt at 1:30 PM 02/25/23.

## 2023-02-25 ENCOUNTER — Encounter: Payer: Self-pay | Admitting: Pulmonary Disease

## 2023-02-25 ENCOUNTER — Ambulatory Visit (INDEPENDENT_AMBULATORY_CARE_PROVIDER_SITE_OTHER): Payer: BC Managed Care – PPO | Admitting: Pulmonary Disease

## 2023-02-25 VITALS — BP 130/80 | HR 82 | Ht 74.0 in | Wt 203.2 lb

## 2023-02-25 DIAGNOSIS — Z789 Other specified health status: Secondary | ICD-10-CM | POA: Diagnosis not present

## 2023-02-25 DIAGNOSIS — R911 Solitary pulmonary nodule: Secondary | ICD-10-CM | POA: Diagnosis not present

## 2023-02-25 DIAGNOSIS — Z7722 Contact with and (suspected) exposure to environmental tobacco smoke (acute) (chronic): Secondary | ICD-10-CM | POA: Diagnosis not present

## 2023-02-25 NOTE — Patient Instructions (Addendum)
Thank you for visiting Dr. Tonia Brooms at [facility]. Today we recommend the following:  Orders Placed This Encounter  Procedures   NM PET Image Initial (PI) Skull Base To Thigh (F-18 FDG)    Return in about 4 weeks (around 03/25/2023) for with Kandice Robinsons, NP, or Dr. Tonia Brooms, after PET/CT Chest.    Please do your part to reduce the spread of COVID-19.
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
## 2023-03-03 ENCOUNTER — Encounter: Payer: Self-pay | Admitting: Nurse Practitioner

## 2023-03-03 ENCOUNTER — Ambulatory Visit (INDEPENDENT_AMBULATORY_CARE_PROVIDER_SITE_OTHER): Payer: BC Managed Care – PPO | Admitting: Nurse Practitioner

## 2023-03-03 VITALS — BP 130/82 | HR 80 | Temp 98.6°F | Ht 74.0 in | Wt 200.0 lb

## 2023-03-03 DIAGNOSIS — E559 Vitamin D deficiency, unspecified: Secondary | ICD-10-CM

## 2023-03-03 DIAGNOSIS — E782 Mixed hyperlipidemia: Secondary | ICD-10-CM

## 2023-03-03 DIAGNOSIS — R7309 Other abnormal glucose: Secondary | ICD-10-CM | POA: Diagnosis not present

## 2023-03-03 DIAGNOSIS — R0989 Other specified symptoms and signs involving the circulatory and respiratory systems: Secondary | ICD-10-CM | POA: Diagnosis not present

## 2023-03-03 DIAGNOSIS — N529 Male erectile dysfunction, unspecified: Secondary | ICD-10-CM

## 2023-03-03 DIAGNOSIS — J069 Acute upper respiratory infection, unspecified: Secondary | ICD-10-CM

## 2023-03-03 NOTE — Patient Instructions (Signed)
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
## 2023-03-13 ENCOUNTER — Ambulatory Visit (HOSPITAL_COMMUNITY)
Admission: RE | Admit: 2023-03-13 | Discharge: 2023-03-13 | Disposition: A | Discharge status: Home / Self Care | Payer: BC Managed Care – PPO | Source: Ambulatory Visit | Attending: Pulmonary Disease

## 2023-03-13 DIAGNOSIS — R911 Solitary pulmonary nodule: Secondary | ICD-10-CM | POA: Diagnosis present

## 2023-03-13 LAB — GLUCOSE, CAPILLARY: Glucose-Capillary: 102 mg/dL — ABNORMAL HIGH (ref 70–99)

## 2023-03-13 MED ORDER — FLUDEOXYGLUCOSE F - 18 (FDG) INJECTION
10.0000 | Freq: Once | INTRAVENOUS | Status: AC
  Administered 2023-03-13: 9.95 via INTRAVENOUS

## 2023-03-24 NOTE — Progress Notes (Signed)
Adam Berry, this patient needs a repeat ct chest in 6 months.   He needs a renal ultrasound and an appt with Urology to review.   Thanks,  BLI  Josephine Igo, DO [facility] 03/24/2023 2:02 PM

## 2023-04-06 ENCOUNTER — Ambulatory Visit
Admission: RE | Admit: 2023-04-06 | Discharge: 2023-04-06 | Disposition: A | Discharge status: Home / Self Care | Payer: BC Managed Care – PPO | Source: Ambulatory Visit | Attending: Pulmonary Disease

## 2023-04-06 ENCOUNTER — Other Ambulatory Visit: Payer: Self-pay | Admitting: Emergency Medicine

## 2023-04-06 DIAGNOSIS — N281 Cyst of kidney, acquired: Secondary | ICD-10-CM

## 2023-04-06 DIAGNOSIS — R911 Solitary pulmonary nodule: Secondary | ICD-10-CM

## 2023-04-16 ENCOUNTER — Other Ambulatory Visit: Payer: Self-pay | Admitting: Internal Medicine

## 2023-04-24 ENCOUNTER — Telehealth: Payer: Self-pay | Admitting: Pulmonary Disease

## 2023-04-24 NOTE — Telephone Encounter (Signed)
Call report. Call back # [phone]

## 2023-04-24 NOTE — Telephone Encounter (Signed)
Call report on renal US 04/06/23   Impression:  IMPRESSION: 1. There is a 3.1 cm hypoechoic mass in the superior pole of the RIGHT kidney which is favored to correspond to the mass noted on recent PET-CT. This remains indeterminate. Recommend further evaluation with dedicated renal protocol CT or MRI with and without contrast. 2. Prostatomegaly with median lobe hypertrophy.   These results will be called to the ordering clinician or representative by the Radiologist Assistant, and communication documented in the PACS or [employer].     Electronically Signed   By: Meda Klinefelter M.D.   On: 04/24/2023 13:13

## 2023-04-24 NOTE — Progress Notes (Signed)
Robynn Pane, please ensure he has an appt with urology  Thanks,  BLI  Josephine Igo, DO [facility] 04/24/2023 4:53 PM

## 2023-04-27 ENCOUNTER — Other Ambulatory Visit: Payer: Self-pay | Admitting: Emergency Medicine

## 2023-04-27 DIAGNOSIS — N281 Cyst of kidney, acquired: Secondary | ICD-10-CM

## 2023-04-27 NOTE — Telephone Encounter (Signed)
PCCs, please advise on urology referral placed on 9/30. Patient had a renal US on 9/30, resulted on 10/18. Due to the results of the [place] this needs to be an urgent referral.

## 2023-04-29 ENCOUNTER — Other Ambulatory Visit: Payer: BC Managed Care – PPO

## 2023-04-29 NOTE — Telephone Encounter (Signed)
Patient does not need a repeat renal ultrasound. PCCs, please advise when this urgent urology referral has been scheduled. Thanks.

## 2023-05-01 NOTE — Telephone Encounter (Signed)
Patient was seen by [facility] on 10/24. Called [facility] and requested office notes be faxed to our office. Front office rep advised the medical records personnel is not in the office currently and would not be able to send records today. As Dr. Tonia Brooms is the referring provider I verified that the office note was CC's to Dr. Tonia Brooms, front office rep confirmed this. Will forward to Dr. Tonia Brooms as Lorain Childes.

## 2023-05-08 ENCOUNTER — Encounter: Payer: Self-pay | Admitting: Internal Medicine

## 2023-05-19 ENCOUNTER — Encounter: Payer: BC Managed Care – PPO | Admitting: Internal Medicine
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
## 2023-06-08 ENCOUNTER — Ambulatory Visit (INDEPENDENT_AMBULATORY_CARE_PROVIDER_SITE_OTHER): Payer: BC Managed Care – PPO | Admitting: Nurse Practitioner

## 2023-06-08 VITALS — BP 122/84 | HR 76 | Temp 98.0°F | Ht 74.0 in | Wt 203.2 lb

## 2023-06-08 DIAGNOSIS — E782 Mixed hyperlipidemia: Secondary | ICD-10-CM

## 2023-06-08 DIAGNOSIS — E559 Vitamin D deficiency, unspecified: Secondary | ICD-10-CM | POA: Diagnosis not present

## 2023-06-08 DIAGNOSIS — Z9289 Personal history of other medical treatment: Secondary | ICD-10-CM

## 2023-06-08 DIAGNOSIS — Z0001 Encounter for general adult medical examination with abnormal findings: Secondary | ICD-10-CM

## 2023-06-08 DIAGNOSIS — Z125 Encounter for screening for malignant neoplasm of prostate: Secondary | ICD-10-CM | POA: Diagnosis not present

## 2023-06-08 DIAGNOSIS — N401 Enlarged prostate with lower urinary tract symptoms: Secondary | ICD-10-CM | POA: Diagnosis not present

## 2023-06-08 DIAGNOSIS — Z131 Encounter for screening for diabetes mellitus: Secondary | ICD-10-CM

## 2023-06-08 DIAGNOSIS — R350 Frequency of micturition: Secondary | ICD-10-CM | POA: Diagnosis not present

## 2023-06-08 DIAGNOSIS — R7309 Other abnormal glucose: Secondary | ICD-10-CM

## 2023-06-08 DIAGNOSIS — Z130 Encounter for screening for diseases of the blood and blood-forming organs and certain disorders involving the immune mechanism: Secondary | ICD-10-CM

## 2023-06-08 DIAGNOSIS — N2889 Other specified disorders of kidney and ureter: Secondary | ICD-10-CM

## 2023-06-08 DIAGNOSIS — Z1389 Encounter for screening for other disorder: Secondary | ICD-10-CM | POA: Diagnosis not present

## 2023-06-08 DIAGNOSIS — N529 Male erectile dysfunction, unspecified: Secondary | ICD-10-CM

## 2023-06-08 DIAGNOSIS — Z0000 Encounter for general adult medical examination without abnormal findings: Secondary | ICD-10-CM | POA: Diagnosis not present

## 2023-06-08 DIAGNOSIS — Z79899 Other long term (current) drug therapy: Secondary | ICD-10-CM | POA: Diagnosis not present

## 2023-06-08 DIAGNOSIS — I639 Cerebral infarction, unspecified: Secondary | ICD-10-CM

## 2023-06-08 DIAGNOSIS — R0989 Other specified symptoms and signs involving the circulatory and respiratory systems: Secondary | ICD-10-CM

## 2023-06-08 DIAGNOSIS — Z8673 Personal history of transient ischemic attack (TIA), and cerebral infarction without residual deficits: Secondary | ICD-10-CM

## 2023-06-08 DIAGNOSIS — R911 Solitary pulmonary nodule: Secondary | ICD-10-CM

## 2023-06-08 DIAGNOSIS — K5730 Diverticulosis of large intestine without perforation or abscess without bleeding: Secondary | ICD-10-CM

## 2023-06-08 DIAGNOSIS — Z136 Encounter for screening for cardiovascular disorders: Secondary | ICD-10-CM | POA: Diagnosis not present

## 2023-06-08 DIAGNOSIS — R972 Elevated prostate specific antigen [PSA]: Secondary | ICD-10-CM

## 2023-06-08 DIAGNOSIS — Z13220 Encounter for screening for lipoid disorders: Secondary | ICD-10-CM

## 2023-06-08 NOTE — Progress Notes (Signed)
Complete Physical  Assessment and Plan:  Encounter for general adult medical examination with abnormal findings Due annually  Health maintenance reviewed Healthily lifestyle goals set  - CBC with Differential/Platelet - COMPLETE METABOLIC PANEL WITH GFR - Magnesium - Lipid panel - TSH - Hemoglobin A1c - Insulin, random - VITAMIN D 25 Hydroxy (Vit-D Deficiency, Fractures) - EKG 12-Lead - Urinalysis, Routine w reflex microscopic - Microalbumin / creatinine urine ratio - Vitamin B12 - PSA  Labile hypertension Controlled via lifestyle - Continue ASA Discussed DASH (Dietary Approaches to Stop Hypertension) DASH diet is lower in sodium than a typical American diet. Cut back on foods that are high in saturated fat, cholesterol, and trans fats. Eat more whole-grain foods, fish, poultry, and nuts Remain active and exercise as tolerated daily.  Monitor BP at home-Call if greater than 130/80.  Check CMP/CBC  - CBC with Differential/Platelet - COMPLETE METABOLIC PANEL WITH GFR  Hyperlipidemia, mixed Continue Rosuvastatin 20 mg every day, Ezetimibe 10 mg Discussed lifestyle modifications. Recommended diet heavy in fruits and veggies, omega 3's. Decrease consumption of animal meats, cheeses, and dairy products. Remain active and exercise as tolerated. Continue to monitor. Check lipids/TSH  - Lipid panel  Vitamin D deficiency Continue supplement for goal of 60-100 Monitor Vitamin D levels  - Magnesium - VITAMIN D 25 Hydroxy (Vit-D Deficiency, Fractures)  Abnormal glucose Education: Reviewed 'ABCs' of diabetes management  Discussed goals to be met and/or maintained include A1C (<7) Blood pressure (<130/80) Cholesterol (LDL <70) Continue Eye Exam yearly  Continue Dental Exam Q6 mo Discussed dietary recommendations Discussed Physical Activity recommendations Check A1C  - Hemoglobin A1c - Insulin, random  Erectile dysfunction, unspecified erectile dysfunction  type Continue Zinc  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 Bjorn Pippin Monitor BP and cholesterol  - Lipid panel - EKG 12-Lead  Elevated PSA/Screening for prostate cancer/right renal mass Followed by [facility] - last 04/2023 has f/u 06/26/23 Monitor levels  - PSA  Diverticulosis of large intestine without hemorrhage Controlled - no recent flare High fiber diet Stay well hydrated  Medication management All medications discussed and reviewed in full. All questions and concerns regarding medications addressed.    - CBC with Differential/Platelet - COMPLETE METABOLIC PANEL WITH GFR - Magnesium - Lipid panel - TSH - Hemoglobin A1c - Insulin, random - VITAMIN D 25 Hydroxy (Vit-D Deficiency, Fractures) - EKG 12-Lead - Urinalysis, Routine w reflex microscopic - Microalbumin / creatinine urine ratio - Vitamin B12 - PSA  Screening for cardiovascular condition - EKG  Screening for hematuria or proteinuria  - Urinalysis, Routine w reflex microscopic - Microalbumin / creatinine urine ratio  Screening for deficiency anemia  - Vitamin B12  Lung nodules Thought to be benign - following Pulmonology, Dr. Tonia Brooms - last seen 02/2023   Orders Placed This Encounter  Procedures   MICROSCOPIC MESSAGE   CBC with Differential/Platelet   COMPLETE METABOLIC PANEL WITH GFR   Magnesium   Lipid panel   TSH   Hemoglobin A1c   Insulin, random   VITAMIN D 25 Hydroxy (Vit-D Deficiency, Fractures)   Urinalysis, Routine w reflex microscopic   Microalbumin / creatinine urine ratio   Vitamin B12   PSA   EKG 12-Lead    Notify office for further evaluation and treatment, questions or concerns if any reported s/s fail to improve.   The patient was advised to call back or seek an in-person evaluation if any symptoms worsen or if the condition fails to  improve as anticipated.   Further disposition pending  results of labs. Discussed med's effects and SE's.    I discussed the assessment and treatment plan with the patient. The patient was provided an opportunity to ask questions and all were answered. The patient agreed with the plan and demonstrated an understanding of the instructions.  Discussed med's effects and SE's. Screening labs and tests as requested with regular follow-up as recommended.  I provided 40 minutes of face-to-face time during this encounter including counseling, chart review, and critical decision making was preformed.  Today's Plan of Care is based on a patient-centered health care approach known as shared decision making - the decisions, tests and treatments allow for patient preferences and values to be balanced with clinical evidence.      Future Appointments  Date Time Provider Department Center  06/09/2023  8:20 AM Little Ishikawa, MD [facility] None  01/11/2024 11:15 AM [facility] 3 [id number]  06/07/2024  2:00 PM Suesan Mohrmann, Archie Patten, NP [facility] None    HPI  67 y.o. male  presents for a complete physical. He has Hyperlipidemia, mixed; Diverticulosis of large intestine; CHEST PAIN, ATYPICAL; Dysphagia; Elevated PSA; History of positive PPD; Erectile dysfunction; Abnormal glucose; Labile hypertension; Vitamin D deficiency; Diplopia; Prediabetes; Hypophosphatemia; Hypomagnesemia; and Hypokalemia on their problem list.  Overall he reports feeling well today. He has no new concerns at this time.    He is followed by Cardiology, Dr. Bjorn Pippin. He presented to the ED 10/23/2022 with acute double vision.  MRI brain showed acute left pontine infarct.  Echocardiogram 10/23/2022 showed EF 60 to 65%, moderate LVH, grade 1 diastolic dysfunction, normal RV function, no significant valvular disease.  Discharged on aspirin plus Plavix x 3 weeks then plan for aspirin alone.  Discharged with cardiac monitor. 30-day event monitor 12/2022 showed 5 episodes of NSVT,  longest lasting 7 seconds, no atrial fibrillation.  Calcium score 0 on 01/2023.  Large lung nodule seen, he was referred to pulmonology; underwent PET scan which showed no FDG uptake indicating likely benign lesion. PET Scan was completed 03/13/23 (Solid nodule of the lingula measuring 13 mm on series 7, image 48 demonstrates no significant FDG uptake above background activity, SUV max is 1.0. No hypermetabolic lymph nodes seen in the Chest). He will follow up with Cardiology in one year.   He follows Dr. Tonia Brooms, Pulmonology, last seen 02/25/23.  Non-smoker. Lived with parents that were both smokers with secondhand smoke exposure used to live in [place] area moved down here after he was 18. He currently builds gas pumps. No other significant occupational exposure history. From respiratory standpoint able to complete all of his actives of daily living. Followed for incidental lingular nodule greater than 1 cm in size seen during calcium scoring.    PET Scan noted incidental CT finding of a right sided lesion in his kidney that is poorly characterized based on the available imaging. Noted to be a 3.1 cm hypoechoic mass in the superior pole of the RIGHT kidney which is favored to correspond to the mass noted on PET-CT. Now established with [facility], sent for Abdominal and Pelvis CT w/without contrast 05/25/23.  Completed through [facility].  Has f/u with [facility] on 06/26/23.  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   BMI is Body mass index is 26.09 kg/m., he has been working on diet and exercise. Wt Readings from Last 3 Encounters:  06/08/23 203 lb 3.2 oz (92.2 kg)  03/03/23  200 lb (90.7 kg)  02/25/23 203 lb 3.2 oz (92.2 kg)   His blood pressure has been controlled at home, today their BP is BP: 122/84 He does workout. He denies chest pain, shortness of breath, dizziness.  He is on cholesterol medication Rosuvastatin 5 mg and Zetia, Omega 3 Fish Oil, Black seed oil.  Has been on RYRS  in the past but not currently. He denies myalgias. His cholesterol is at goal. The cholesterol last visit was:   Lab Results  Component Value Date   CHOL 133 11/19/2022   HDL 47 11/19/2022   LDLCALC 67 11/19/2022   LDLDIRECT 148.9 04/20/2013   TRIG 100 11/19/2022   CHOLHDL 2.8 11/19/2022    He has been working on diet and exercise for Pre DM , he is on bASA, he is not on ACE/ARB and denies polydipsia and polyuria. Last A1C in the office was:  Lab Results  Component Value Date   HGBA1C 6.0 (H) 11/19/2022   Last GFR: Lab Results  Component Value Date   EGFR 83 11/19/2022   Patient is on Vitamin D supplement.   Lab Results  Component Value Date   VD25OH 39 11/19/2022     Last PSA was: Lab Results  Component Value Date   PSA 3.88 05/13/2022     Current Medications:  Current Outpatient Medications on File Prior to Visit  Medication Sig Dispense Refill   aspirin 81 MG tablet Take 81 mg by mouth daily.     Cholecalciferol (VITAMIN D3) 125 MCG (5000 UT) CAPS Take 10,000 Units by mouth daily.     rosuvastatin (CRESTOR) 20 MG tablet TAKE ONE TABLET BY MOUTH ONCE A DAY 30 tablet 0   UNABLE TO FIND Take 15 mLs by mouth daily. Med Name: Blackseed oil     vitamin C (ASCORBIC ACID) 500 MG tablet Take 500 mg by mouth daily.     Zinc 50 MG TABS Take 50 mg by mouth daily.     No current facility-administered medications on file prior to visit.   Allergies:  Allergies  Allergen Reactions   Ppd [Tuberculin Purified Protein Derivative] Other (See Comments)    Hx/o strong reaction in childhood   Medical History:  He has Hyperlipidemia, mixed; Diverticulosis of large intestine; CHEST PAIN, ATYPICAL; Dysphagia; Elevated PSA; History of positive PPD; Erectile dysfunction; Abnormal glucose; Labile hypertension; Vitamin D deficiency; Diplopia; Prediabetes; Hypophosphatemia; Hypomagnesemia; and Hypokalemia on their problem list.  Health Maintenance:   Immunization History  Administered  Date(s) Administered   PPD Test 01/27/2019   Td 08/07/2015   Tdap 04/21/2007   Health Maintenance  Topic Date Due   COVID-19 Vaccine (1) Never done   Hepatitis C Screening  Never done   Zoster Vaccines- Shingrix (1 of 2) Never done   Pneumonia Vaccine 24+ Years old (1 of 1 - PCV) Never done   INFLUENZA VACCINE  Never done   DTaP/Tdap/Td (3 - Td or Tdap) 08/06/2025   Colonoscopy  08/19/2026   HPV VACCINES  Aged Out    Last Dental Exam:  Follows Q6 months Last Eye Exam:  2024 Last Derm Exam: No concerns  Patient Care Team: Lucky Cowboy, MD as PCP - General (Internal Medicine) Lucky Cowboy, MD as Referring Physician (Internal Medicine) Lucky Cowboy, MD as Referring Physician (Internal Medicine)  Surgical History:  He has a past surgical history that includes Colonoscopy. Family History:  Hisfamily history includes Alcohol abuse in his father and paternal grandfather; Cancer in his maternal grandmother;  Colon cancer in his paternal uncle; Dementia in his maternal grandfather; Emphysema in his mother; Heart attack (age of onset: 24) in his father. Social History:  He reports that he has never smoked. He has never used smokeless tobacco. He reports current alcohol use of about 1.0 standard drink of alcohol per week. He reports that he does not use drugs.  Review of Systems: Review of Systems  Constitutional: Negative.   HENT: Negative.    Eyes: Negative.   Respiratory: Negative.    Cardiovascular: Negative.   Gastrointestinal: Negative.   Genitourinary: Negative.   Musculoskeletal: Negative.   Skin: Negative.   Neurological: Negative.   Endo/Heme/Allergies: Negative.   Psychiatric/Behavioral: Negative.      Physical Exam: Estimated body mass index is 26.09 kg/m as calculated from the following:   Height as of this encounter: 6\' 2"  (1.88 m).   Weight as of this encounter: 203 lb 3.2 oz (92.2 kg). BP 122/84   Pulse 76   Temp 98 F (36.7 C)   Ht 6\' 2"   (1.88 m)   Wt 203 lb 3.2 oz (92.2 kg)   SpO2 98%   BMI 26.09 kg/m   General Appearance: Well nourished, well developed, in no apparent distress.  Eyes: PERRLA, EOMs, conjunctiva no swelling or erythema, normal fundi and vessels.  Sinuses: No Frontal/maxillary tenderness  ENT/Mouth: Ext aud canals clear, normal light reflex with TMs without erythema, bulging. Good dentition. No erythema, swelling, or exudate on post pharynx. Tonsils not swollen or erythematous. Hearing normal.  Neck: Supple, thyroid normal. No bruits  Respiratory: Respiratory effort normal, BS equal bilaterally without rales, rhonchi, wheezing or stridor.  Cardio: RRR without murmurs, rubs or gallops. Brisk peripheral pulses without edema.  Chest: symmetric, with normal excursions and percussion.  Abdomen: Soft, nontender, no guarding, rebound, hernias, masses, or organomegaly.  Lymphatics: Non tender without lymphadenopathy.  Musculoskeletal: Full ROM all peripheral extremities,5/5 strength, and normal gait.  Skin: Warm, dry without rashes, lesions, ecchymosis. Neuro: Cranial nerves intact, reflexes equal bilaterally. Normal muscle tone, no cerebellar symptoms. Sensation intact.  Psych: Awake and oriented X 3, normal affect, Insight and Judgment appropriate.  Genitourinary: Male genitalia: not done  EKG: Defer - Cardiology following  Adela Glimpse, NP 2:21 PM [facility]

## 2023-06-09 ENCOUNTER — Ambulatory Visit: Payer: BC Managed Care – PPO | Attending: Cardiology | Admitting: Cardiology

## 2023-06-09 ENCOUNTER — Encounter: Payer: Self-pay | Admitting: Cardiology

## 2023-06-09 VITALS — BP 132/84 | HR 71 | Ht 73.0 in | Wt 206.0 lb

## 2023-06-09 DIAGNOSIS — I639 Cerebral infarction, unspecified: Secondary | ICD-10-CM

## 2023-06-09 DIAGNOSIS — E785 Hyperlipidemia, unspecified: Secondary | ICD-10-CM | POA: Diagnosis not present

## 2023-06-09 DIAGNOSIS — Z8249 Family history of ischemic heart disease and other diseases of the circulatory system: Secondary | ICD-10-CM

## 2023-06-09 LAB — MICROALBUMIN / CREATININE URINE RATIO
Creatinine, Urine: 232 mg/dL (ref 20–320)
Microalb Creat Ratio: 8 mg/g{creat} (ref <30)
Microalb, Ur: 1.9 mg/dL

## 2023-06-09 LAB — CBC WITH DIFFERENTIAL/PLATELET
Absolute Lymphocytes: 2065 {cells}/uL (ref 850–3900)
Absolute Monocytes: 401 {cells}/uL (ref 200–950)
Basophils Absolute: 30 {cells}/uL (ref 0–200)
Basophils Relative: 0.5 %
Eosinophils Absolute: 372 {cells}/uL (ref 15–500)
Eosinophils Relative: 6.3 %
HCT: 44.4 % (ref 38.5–50.0)
Hemoglobin: 14.2 g/dL (ref 13.2–17.1)
MCH: 27.3 pg (ref 27.0–33.0)
MCHC: 32 g/dL (ref 32.0–36.0)
MCV: 85.2 fL (ref 80.0–100.0)
MPV: 9.7 fL (ref 7.5–12.5)
Monocytes Relative: 6.8 %
Neutro Abs: 3033 {cells}/uL (ref 1500–7800)
Neutrophils Relative %: 51.4 %
Platelets: 281 10*3/uL (ref 140–400)
RBC: 5.21 10*6/uL (ref 4.20–5.80)
RDW: 13.3 % (ref 11.0–15.0)
Total Lymphocyte: 35 %
WBC: 5.9 10*3/uL (ref 3.8–10.8)

## 2023-06-09 LAB — VITAMIN D 25 HYDROXY (VIT D DEFICIENCY, FRACTURES): Vit D, 25-Hydroxy: 79 ng/mL (ref 30–100)

## 2023-06-09 LAB — COMPLETE METABOLIC PANEL WITH GFR
AG Ratio: 1.4 (calc) (ref 1.0–2.5)
ALT: 19 U/L (ref 9–46)
AST: 17 U/L (ref 10–35)
Albumin: 4.4 g/dL (ref 3.6–5.1)
Alkaline phosphatase (APISO): 79 U/L (ref 35–144)
BUN: 12 mg/dL (ref 7–25)
CO2: 31 mmol/L (ref 20–32)
Calcium: 9.9 mg/dL (ref 8.6–10.3)
Chloride: 102 mmol/L (ref 98–110)
Creat: 1.08 mg/dL (ref 0.70–1.35)
Globulin: 3.1 g/dL (ref 1.9–3.7)
Glucose, Bld: 86 mg/dL (ref 65–99)
Potassium: 4.3 mmol/L (ref 3.5–5.3)
Sodium: 138 mmol/L (ref 135–146)
Total Bilirubin: 0.3 mg/dL (ref 0.2–1.2)
Total Protein: 7.5 g/dL (ref 6.1–8.1)
eGFR: 75 mL/min/{1.73_m2} (ref >=60)

## 2023-06-09 LAB — PSA: PSA: 4.15 ng/mL — ABNORMAL HIGH (ref <=4.00)

## 2023-06-09 LAB — LIPID PANEL
Cholesterol: 153 mg/dL (ref <200)
HDL: 59 mg/dL (ref >=40)
LDL Cholesterol (Calc): 78 mg/dL
Non-HDL Cholesterol (Calc): 94 mg/dL (ref <130)
Total CHOL/HDL Ratio: 2.6 (calc) (ref <5.0)
Triglycerides: 82 mg/dL (ref <150)

## 2023-06-09 LAB — URINALYSIS, ROUTINE W REFLEX MICROSCOPIC
Bacteria, UA: NONE SEEN /[HPF]
Bilirubin Urine: NEGATIVE
Glucose, UA: NEGATIVE
Hgb urine dipstick: NEGATIVE
Hyaline Cast: NONE SEEN /[LPF]
Leukocytes,Ua: NEGATIVE
Nitrite: NEGATIVE
Specific Gravity, Urine: 1.03 (ref 1.001–1.035)
Squamous Epithelial / HPF: NONE SEEN /[HPF] (ref <=5)
WBC, UA: NONE SEEN /[HPF] (ref 0–5)
pH: 5.5 (ref 5.0–8.0)

## 2023-06-09 LAB — HEMOGLOBIN A1C
Hgb A1c MFr Bld: 5.7 %{Hb} — ABNORMAL HIGH (ref <5.7)
Mean Plasma Glucose: 117 mg/dL
eAG (mmol/L): 6.5 mmol/L

## 2023-06-09 LAB — MICROSCOPIC MESSAGE

## 2023-06-09 LAB — MAGNESIUM: Magnesium: 2 mg/dL (ref 1.5–2.5)

## 2023-06-09 LAB — INSULIN, RANDOM: Insulin: 19.4 u[IU]/mL — ABNORMAL HIGH

## 2023-06-09 LAB — VITAMIN B12: Vitamin B-12: 617 pg/mL (ref 200–1100)

## 2023-06-09 LAB — TSH: TSH: 1.28 m[IU]/L (ref 0.40–4.50)

## 2023-06-09 MED ORDER — EZETIMIBE 10 MG PO TABS: 10.0000 mg | ORAL_TABLET | Freq: Every day | ORAL | 3 refills | Status: AC

## 2023-06-09 NOTE — Patient Instructions (Signed)
Medication Instructions:  Start Zetia 10 mg daily Continue all current medications *If you need a refill on your cardiac medications before your next appointment, please call your pharmacy*   Lab Work: Lipid panel  Please make sure you have nothing to eat before this test If you have labs (blood work) drawn today and your tests are completely normal, you will receive your results only by: MyChart Message (if you have MyChart) OR A paper copy in the mail If you have any lab test that is abnormal or we need to change your treatment, we will call you to review the results.   Testing/Procedures: none   Follow-Up: At [facility], you and your health needs are our priority.  As part of our continuing mission to provide you with exceptional heart care, we have created designated Provider Care Teams.  These Care Teams include your primary Cardiologist (physician) and Advanced Practice Providers (APPs -  Physician Assistants and Nurse Practitioners) who all work together to provide you with the care you need, when you need it.  We recommend signing up for the patient portal called "MyChart".  Sign up information is provided on this After Visit Summary.  MyChart is used to connect with patients for Virtual Visits (Telemedicine).  Patients are able to view lab/test results, encounter notes, upcoming appointments, etc.  Non-urgent messages can be sent to your provider as well.   To learn more about what you can do with MyChart, go to [email or web address].    Your next appointment:   1 year(s)  Provider:   Dr. Bjorn Pippin  Other Instructions none

## 2023-06-10 ENCOUNTER — Other Ambulatory Visit: Payer: Self-pay | Admitting: Nurse Practitioner

## 2023-06-15 ENCOUNTER — Encounter: Payer: Self-pay | Admitting: Nurse Practitioner

## 2023-06-15 NOTE — Patient Instructions (Signed)

## 2023-08-05 ENCOUNTER — Other Ambulatory Visit: Payer: Self-pay | Admitting: Nurse Practitioner

## 2023-08-14 ENCOUNTER — Other Ambulatory Visit: Payer: Self-pay | Admitting: Urology

## 2023-09-07 NOTE — Addendum Note (Signed)
 Addended by: Lajoyce Lauber A on: 09/07/2023 10:21 AM   Modules accepted: Orders

## 2023-09-08 ENCOUNTER — Ambulatory Visit: Payer: BC Managed Care – PPO | Admitting: Internal Medicine

## 2023-09-09 ENCOUNTER — Other Ambulatory Visit: Payer: BC Managed Care – PPO

## 2023-09-15 ENCOUNTER — Ambulatory Visit
Admission: RE | Admit: 2023-09-15 | Discharge: 2023-09-15 | Disposition: A | Discharge status: Home / Self Care | Source: Ambulatory Visit | Attending: Pulmonary Disease | Admitting: Pulmonary Disease

## 2023-09-15 DIAGNOSIS — R911 Solitary pulmonary nodule: Secondary | ICD-10-CM

## 2023-09-16 NOTE — Progress Notes (Addendum)
 Anesthesia Review:  PCP:   Dr Oneta Rack( deceased) Leodis Rains- LOV July 01, 2023  Cardiologist : Bjorn Pippin LOV 06/09/23  Neuro- Amy Lomax, NP 01/14/23  Pulm- Icard- LOV 02/25/23   PPM/ ICD: Device Orders: Rep Notified:  Chest x-ray :  CT Chest- 09/15/23  EKG : 06/15/23  Echo : 01/14/23  CT CArd- 01/16/23  Stress test: Cardiac Cath :   Activity level: can do a flight of stairs without difficulty  Sleep Study/ CPAP : none  Fasting Blood Sugar :      / Checks Blood Sugar -- times a day:    Blood Thinner/ Instructions /Last Dose: ASA / Instructions/ Last Dose :    81 mg aspirin    PT came ot preop on 09/21/23.  Pt unsure if surgery is on right or left kidney.  Instructed pt to call surgeon office ( number given ) and to talk with them.  PT voiced understanding.  Consent to be signed DOS.

## 2023-09-16 NOTE — Patient Instructions (Signed)
 SURGICAL WAITING ROOM VISITATION  Patients having surgery or a procedure may have no more than 2 support people in the waiting area - these visitors may rotate.    Children under the age of 43 must have an adult with them who is not the patient.  Due to an increase in RSV and influenza rates and associated hospitalizations, children ages 51 and under may not visit patients in [facility] hospitals.  Visitors with respiratory illnesses are discouraged from visiting and should remain at home.  If the patient needs to stay at the hospital during part of their recovery, the visitor guidelines for inpatient rooms apply. Pre-op nurse will coordinate an appropriate time for 1 support person to accompany patient in pre-op.  This support person may not rotate.    Please refer to the [facility] website for the visitor guidelines for Inpatients (after your surgery is over and you are in a regular room).       Your procedure is scheduled on:  09/30/23    Report to [facility] Main Entrance    Report to admitting at  0515 AM   Call this number if you have problems the morning of surgery [phone]   Do not eat food  or drink liquids :After Midnight.              If you have questions, please contact your surgeon's office.   FOLLOW BOWEL PREP AND ANY ADDITIONAL PRE OP INSTRUCTIONS YOU RECEIVED FROM YOUR SURGEON'S OFFICE!!!     Oral Hygiene is also important to reduce your risk of infection.                                    Remember - BRUSH YOUR TEETH THE MORNING OF SURGERY WITH YOUR REGULAR TOOTHPASTE  DENTURES WILL BE REMOVED PRIOR TO SURGERY PLEASE DO NOT APPLY "Poly grip" OR ADHESIVES!!!   Do NOT smoke after Midnight   Stop all vitamins and herbal supplements 7 days before surgery.   Take these medicines the morning of surgery with A SIP OF WATER:  none   DO NOT TAKE ANY ORAL DIABETIC MEDICATIONS DAY OF YOUR SURGERY  Bring CPAP mask and tubing day of surgery.                               You may not have any metal on your body including hair pins, jewelry, and body piercing             Do not wear make-up, lotions, powders, perfumes/cologne, or deodorant  Do not wear nail polish including gel and S&S, artificial/acrylic nails, or any other type of covering on natural nails including finger and toenails. If you have artificial nails, gel coating, etc. that needs to be removed by a nail salon please have this removed prior to surgery or surgery may need to be canceled/ delayed if the surgeon/ anesthesia feels like they are unable to be safely monitored.   Do not shave  48 hours prior to surgery.               Men may shave face and neck.   Do not bring valuables to the hospital. [facility] IS NOT             RESPONSIBLE   FOR VALUABLES.   Contacts, glasses, dentures or bridgework may  not be worn into surgery.   Bring small overnight bag day of surgery.   DO NOT BRING YOUR HOME MEDICATIONS TO THE HOSPITAL. PHARMACY WILL DISPENSE MEDICATIONS LISTED ON YOUR MEDICATION LIST TO YOU DURING YOUR ADMISSION IN THE HOSPITAL!    Patients discharged on the day of surgery will not be allowed to drive home.  Someone NEEDS to stay with you for the first 24 hours after anesthesia.   Special Instructions: Bring a copy of your healthcare power of attorney and living will documents the day of surgery if you haven't scanned them before.              Please read over the following fact sheets you were given: IF YOU HAVE QUESTIONS ABOUT YOUR PRE-OP INSTRUCTIONS PLEASE CALL [phone]   If you received a COVID test during your pre-op visit  it is requested that you wear a mask when out in public, stay away from anyone that may not be feeling well and notify your surgeon if you develop symptoms. If you test positive for Covid or have been in contact with anyone that has tested positive in the last 10 days please notify you surgeon.    [facility] - Preparing for  Surgery Before surgery, you can play an important role.  Because skin is not sterile, your skin needs to be as free of germs as possible.  You can reduce the number of germs on your skin by washing with CHG (chlorahexidine gluconate) soap before surgery.  CHG is an antiseptic cleaner which kills germs and bonds with the skin to continue killing germs even after washing. Please DO NOT use if you have an allergy to CHG or antibacterial soaps.  If your skin becomes reddened/irritated stop using the CHG and inform your nurse when you arrive at Short Stay. Do not shave (including legs and underarms) for at least 48 hours prior to the first CHG shower.  You may shave your face/neck. Please follow these instructions carefully:  1.  Shower with CHG Soap the night before surgery and the  morning of Surgery.  2.  If you choose to wash your hair, wash your hair first as usual with your  normal  shampoo.  3.  After you shampoo, rinse your hair and body thoroughly to remove the  shampoo.                           4.  Use CHG as you would any other liquid soap.  You can apply chg directly  to the skin and wash                       Gently with a scrungie or clean washcloth.  5.  Apply the CHG Soap to your body ONLY FROM THE NECK DOWN.   Do not use on face/ open                           Wound or open sores. Avoid contact with eyes, ears mouth and genitals (private parts).                       Wash face,  Genitals (private parts) with your normal soap.             6.  Wash thoroughly, paying special attention to the area where your surgery  will be performed.  7.  Thoroughly rinse your body with warm water from the neck down.  8.  DO NOT shower/wash with your normal soap after using and rinsing off  the CHG Soap.                9.  Pat yourself dry with a clean towel.            10.  Wear clean pajamas.            11.  Place clean sheets on your bed the night of your first shower and do not  sleep with pets. Day  of Surgery : Do not apply any lotions/deodorants the morning of surgery.  Please wear clean clothes to the hospital/surgery center.  FAILURE TO FOLLOW THESE INSTRUCTIONS MAY RESULT IN THE CANCELLATION OF YOUR SURGERY PATIENT SIGNATURE_________________________________  NURSE SIGNATURE__________________________________  ________________________________________________________________________

## 2023-09-21 ENCOUNTER — Other Ambulatory Visit: Payer: Self-pay

## 2023-09-21 ENCOUNTER — Encounter (HOSPITAL_COMMUNITY)
Admission: RE | Admit: 2023-09-21 | Discharge: 2023-09-21 | Disposition: A | Discharge status: Home / Self Care | Payer: BC Managed Care – PPO | Source: Ambulatory Visit | Attending: Urology | Admitting: Urology

## 2023-09-21 ENCOUNTER — Encounter (HOSPITAL_COMMUNITY): Payer: Self-pay

## 2023-09-21 VITALS — BP 136/81 | HR 74 | Temp 98.5°F | Resp 16 | Ht 73.0 in | Wt 206.0 lb

## 2023-09-21 DIAGNOSIS — Z01812 Encounter for preprocedural laboratory examination: Secondary | ICD-10-CM | POA: Diagnosis not present

## 2023-09-21 DIAGNOSIS — Z01818 Encounter for other preprocedural examination: Secondary | ICD-10-CM | POA: Diagnosis present

## 2023-09-21 HISTORY — DX: Cerebral infarction, unspecified: I63.9

## 2023-09-21 LAB — COMPREHENSIVE METABOLIC PANEL
ALT: 24 U/L (ref 0–44)
AST: 23 U/L (ref 15–41)
Albumin: 4.1 g/dL (ref 3.5–5.0)
Alkaline Phosphatase: 66 U/L (ref 38–126)
Anion gap: 8 (ref 5–15)
BUN: 14 mg/dL (ref 8–23)
CO2: 25 mmol/L (ref 22–32)
Calcium: 9.8 mg/dL (ref 8.9–10.3)
Chloride: 104 mmol/L (ref 98–111)
Creatinine, Ser: 0.99 mg/dL (ref 0.61–1.24)
GFR, Estimated: >60 mL/min (ref >60)
Glucose, Bld: 100 mg/dL — ABNORMAL HIGH (ref 70–99)
Potassium: 4 mmol/L (ref 3.5–5.1)
Sodium: 137 mmol/L (ref 135–145)
Total Bilirubin: 0.4 mg/dL (ref 0.0–1.2)
Total Protein: 7.8 g/dL (ref 6.5–8.1)

## 2023-09-21 LAB — CBC
HCT: 43.7 % (ref 39.0–52.0)
Hemoglobin: 13.6 g/dL (ref 13.0–17.0)
MCH: 27.1 pg (ref 26.0–34.0)
MCHC: 31.1 g/dL (ref 30.0–36.0)
MCV: 87.2 fL (ref 80.0–100.0)
Platelets: 295 10*3/uL (ref 150–400)
RBC: 5.01 MIL/uL (ref 4.22–5.81)
RDW: 13.9 % (ref 11.5–15.5)
WBC: 5.2 10*3/uL (ref 4.0–10.5)
nRBC: 0 % (ref 0.0–0.2)

## 2023-09-21 MED ORDER — ROSUVASTATIN CALCIUM 20 MG PO TABS: 20.0000 mg | ORAL_TABLET | Freq: Every day | ORAL | 2 refills | Status: AC

## 2023-09-30 ENCOUNTER — Other Ambulatory Visit: Payer: Self-pay

## 2023-09-30 ENCOUNTER — Ambulatory Visit (HOSPITAL_COMMUNITY): Admitting: Anesthesiology

## 2023-09-30 ENCOUNTER — Encounter (HOSPITAL_COMMUNITY)
Admission: RE | Disposition: A | Discharge status: Home / Self Care | Payer: Self-pay | Source: Ambulatory Visit | Attending: Urology

## 2023-09-30 ENCOUNTER — Encounter (HOSPITAL_COMMUNITY): Payer: Self-pay | Admitting: Urology

## 2023-09-30 ENCOUNTER — Ambulatory Visit (HOSPITAL_COMMUNITY)

## 2023-09-30 ENCOUNTER — Ambulatory Visit (HOSPITAL_COMMUNITY): Payer: Self-pay | Admitting: Physician Assistant

## 2023-09-30 ENCOUNTER — Observation Stay (HOSPITAL_COMMUNITY)
Admission: RE | Admit: 2023-09-30 | Discharge: 2023-10-02 | Disposition: A | Discharge status: Home / Self Care | Payer: BC Managed Care – PPO | Source: Ambulatory Visit | Attending: Urology | Admitting: Urology

## 2023-09-30 DIAGNOSIS — Z01818 Encounter for other preprocedural examination: Secondary | ICD-10-CM

## 2023-09-30 DIAGNOSIS — Z79899 Other long term (current) drug therapy: Secondary | ICD-10-CM | POA: Insufficient documentation

## 2023-09-30 DIAGNOSIS — N2889 Other specified disorders of kidney and ureter: Secondary | ICD-10-CM | POA: Diagnosis present

## 2023-09-30 HISTORY — PX: ROBOTIC ASSITED PARTIAL NEPHRECTOMY: SHX6087

## 2023-09-30 LAB — TYPE AND SCREEN
ABO/RH(D): A POS
Antibody Screen: NEGATIVE

## 2023-09-30 LAB — ABO/RH: ABO/RH(D): A POS

## 2023-09-30 LAB — HEMOGLOBIN AND HEMATOCRIT, BLOOD
HCT: 42.8 % (ref 39.0–52.0)
Hemoglobin: 13.2 g/dL (ref 13.0–17.0)

## 2023-09-30 SURGERY — NEPHRECTOMY, PARTIAL, ROBOT-ASSISTED
Anesthesia: General | Site: Abdomen | Laterality: Right

## 2023-09-30 MED ORDER — SODIUM CHLORIDE 0.9 % IV SOLN: 12.5000 mg | INTRAVENOUS | Status: DC | PRN

## 2023-09-30 MED ORDER — PROPOFOL 10 MG/ML IV BOLUS
INTRAVENOUS | Status: AC
  Filled 2023-09-30: qty 20

## 2023-09-30 MED ORDER — OXYCODONE HCL 5 MG PO TABS: 5.0000 mg | ORAL_TABLET | Freq: Once | ORAL | Status: DC | PRN

## 2023-09-30 MED ORDER — MIDAZOLAM HCL 5 MG/5ML IJ SOLN
INTRAMUSCULAR | Status: DC | PRN
  Administered 2023-09-30: 2 mg via INTRAVENOUS

## 2023-09-30 MED ORDER — ROSUVASTATIN CALCIUM 20 MG PO TABS
20.0000 mg | ORAL_TABLET | Freq: Every day | ORAL | Status: DC
  Administered 2023-09-30 – 2023-10-01 (×2): 20 mg via ORAL
  Filled 2023-09-30 (×4): qty 1

## 2023-09-30 MED ORDER — HYOSCYAMINE SULFATE 0.125 MG SL SUBL: 0.1250 mg | SUBLINGUAL_TABLET | SUBLINGUAL | Status: DC | PRN

## 2023-09-30 MED ORDER — LACTATED RINGERS IV SOLN: INTRAVENOUS | Status: DC

## 2023-09-30 MED ORDER — DIPHENHYDRAMINE HCL 50 MG/ML IJ SOLN: 12.5000 mg | Freq: Four times a day (QID) | INTRAMUSCULAR | Status: DC | PRN

## 2023-09-30 MED ORDER — IOHEXOL 300 MG/ML  SOLN
INTRAMUSCULAR | Status: DC | PRN
  Administered 2023-09-30: 5 mL

## 2023-09-30 MED ORDER — DEXAMETHASONE SODIUM PHOSPHATE 10 MG/ML IJ SOLN
INTRAMUSCULAR | Status: AC
  Filled 2023-09-30: qty 1

## 2023-09-30 MED ORDER — OXYCODONE HCL 5 MG/5ML PO SOLN: 5.0000 mg | Freq: Once | ORAL | Status: DC | PRN

## 2023-09-30 MED ORDER — ACETAMINOPHEN 10 MG/ML IV SOLN
1000.0000 mg | Freq: Four times a day (QID) | INTRAVENOUS | Status: AC
  Administered 2023-09-30 – 2023-10-01 (×3): 1000 mg via INTRAVENOUS
  Filled 2023-09-30 (×3): qty 100

## 2023-09-30 MED ORDER — HYDROMORPHONE HCL 1 MG/ML IJ SOLN
0.5000 mg | INTRAMUSCULAR | Status: DC | PRN
  Administered 2023-09-30: 1 mg via INTRAVENOUS
  Administered 2023-10-01: 0.5 mg via INTRAVENOUS
  Filled 2023-09-30 (×2): qty 1

## 2023-09-30 MED ORDER — LIDOCAINE HCL (CARDIAC) PF 100 MG/5ML IV SOSY
PREFILLED_SYRINGE | INTRAVENOUS | Status: DC | PRN
  Administered 2023-09-30: 100 mg via INTRAVENOUS

## 2023-09-30 MED ORDER — MIDAZOLAM HCL 2 MG/2ML IJ SOLN
INTRAMUSCULAR | Status: AC
  Filled 2023-09-30: qty 2

## 2023-09-30 MED ORDER — SUGAMMADEX SODIUM 200 MG/2ML IV SOLN
INTRAVENOUS | Status: DC | PRN
  Administered 2023-09-30: 200 mg via INTRAVENOUS

## 2023-09-30 MED ORDER — ONDANSETRON HCL 4 MG/2ML IJ SOLN
4.0000 mg | INTRAMUSCULAR | Status: DC | PRN
  Administered 2023-10-01: 4 mg via INTRAVENOUS
  Filled 2023-09-30: qty 2

## 2023-09-30 MED ORDER — SODIUM CHLORIDE (PF) 0.9 % IJ SOLN
INTRAMUSCULAR | Status: AC
  Filled 2023-09-30: qty 20

## 2023-09-30 MED ORDER — DEXAMETHASONE SODIUM PHOSPHATE 10 MG/ML IJ SOLN
INTRAMUSCULAR | Status: DC | PRN
  Administered 2023-09-30: 5 mg via INTRAVENOUS

## 2023-09-30 MED ORDER — LIDOCAINE HCL (PF) 2 % IJ SOLN
INTRAMUSCULAR | Status: AC
  Filled 2023-09-30: qty 5

## 2023-09-30 MED ORDER — ROCURONIUM BROMIDE 100 MG/10ML IV SOLN
INTRAVENOUS | Status: DC | PRN
  Administered 2023-09-30: 10 mg via INTRAVENOUS
  Administered 2023-09-30: 90 mg via INTRAVENOUS

## 2023-09-30 MED ORDER — AMISULPRIDE (ANTIEMETIC) 5 MG/2ML IV SOLN: 10.0000 mg | Freq: Once | INTRAVENOUS | Status: DC | PRN

## 2023-09-30 MED ORDER — ACETAMINOPHEN 10 MG/ML IV SOLN
INTRAVENOUS | Status: AC
Start: 2023-09-30 — End: 2023-10-01
  Administered 2023-09-30: 1000 mg
  Filled 2023-09-30: qty 100

## 2023-09-30 MED ORDER — STERILE WATER FOR IRRIGATION IR SOLN
Status: DC | PRN
  Administered 2023-09-30: 1000 mL

## 2023-09-30 MED ORDER — SODIUM CHLORIDE 0.45 % IV SOLN
INTRAVENOUS | Status: DC
  Administered 2023-09-30: 75 mL/h via INTRAVENOUS

## 2023-09-30 MED ORDER — HYDROMORPHONE HCL 1 MG/ML IJ SOLN: 0.2500 mg | INTRAMUSCULAR | Status: DC | PRN

## 2023-09-30 MED ORDER — CEFAZOLIN SODIUM-DEXTROSE 2-4 GM/100ML-% IV SOLN
2.0000 g | INTRAVENOUS | Status: AC
  Administered 2023-09-30: 2 mg via INTRAVENOUS
  Filled 2023-09-30: qty 100

## 2023-09-30 MED ORDER — HYDROMORPHONE HCL 2 MG/ML IJ SOLN
INTRAMUSCULAR | Status: AC
  Filled 2023-09-30: qty 1

## 2023-09-30 MED ORDER — BUPIVACAINE LIPOSOME 1.3 % IJ SUSP
INTRAMUSCULAR | Status: DC | PRN
  Administered 2023-09-30: 20 mL

## 2023-09-30 MED ORDER — SODIUM CHLORIDE 0.9 % IR SOLN
Status: DC | PRN
Start: 2023-09-30 — End: 2023-09-30
  Administered 2023-09-30: 1000 mL

## 2023-09-30 MED ORDER — DIPHENHYDRAMINE HCL 12.5 MG/5ML PO ELIX: 12.5000 mg | ORAL_SOLUTION | Freq: Four times a day (QID) | ORAL | Status: DC | PRN

## 2023-09-30 MED ORDER — SODIUM CHLORIDE 0.9 % IV SOLN: INTRAVENOUS | Status: DC | PRN

## 2023-09-30 MED ORDER — HYDROMORPHONE HCL 1 MG/ML IJ SOLN
INTRAMUSCULAR | Status: AC
  Filled 2023-09-30: qty 1

## 2023-09-30 MED ORDER — DOCUSATE SODIUM 100 MG PO CAPS
100.0000 mg | ORAL_CAPSULE | Freq: Two times a day (BID) | ORAL | Status: DC
  Administered 2023-09-30 – 2023-10-02 (×4): 100 mg via ORAL
  Filled 2023-09-30 (×4): qty 1

## 2023-09-30 MED ORDER — FENTANYL CITRATE (PF) 100 MCG/2ML IJ SOLN
INTRAMUSCULAR | Status: AC
  Filled 2023-09-30: qty 2

## 2023-09-30 MED ORDER — ONDANSETRON HCL 4 MG/2ML IJ SOLN
INTRAMUSCULAR | Status: DC | PRN
  Administered 2023-09-30: 4 mg via INTRAVENOUS

## 2023-09-30 MED ORDER — TRIPLE ANTIBIOTIC 3.5-400-5000 EX OINT: 1.0000 | TOPICAL_OINTMENT | Freq: Three times a day (TID) | CUTANEOUS | Status: DC | PRN

## 2023-09-30 MED ORDER — BUPIVACAINE-EPINEPHRINE (PF) 0.5% -1:200000 IJ SOLN
INTRAMUSCULAR | Status: AC
  Filled 2023-09-30: qty 30

## 2023-09-30 MED ORDER — SURGIFLO WITH THROMBIN (HEMOSTATIC MATRIX KIT) OPTIME
TOPICAL | Status: DC | PRN
  Administered 2023-09-30: 1 via TOPICAL

## 2023-09-30 MED ORDER — ORAL CARE MOUTH RINSE: 15.0000 mL | Freq: Once | OROMUCOSAL | Status: AC

## 2023-09-30 MED ORDER — TRAMADOL HCL 50 MG PO TABS: 50.0000 mg | ORAL_TABLET | Freq: Four times a day (QID) | ORAL | 0 refills | Status: AC | PRN

## 2023-09-30 MED ORDER — TRAMADOL HCL 50 MG PO TABS
50.0000 mg | ORAL_TABLET | Freq: Four times a day (QID) | ORAL | Status: DC | PRN
  Administered 2023-10-01 – 2023-10-02 (×3): 50 mg via ORAL
  Filled 2023-09-30 (×3): qty 1

## 2023-09-30 MED ORDER — FENTANYL CITRATE (PF) 100 MCG/2ML IJ SOLN
INTRAMUSCULAR | Status: DC | PRN
  Administered 2023-09-30: 100 ug via INTRAVENOUS

## 2023-09-30 MED ORDER — ONDANSETRON HCL 4 MG/2ML IJ SOLN
INTRAMUSCULAR | Status: AC
  Filled 2023-09-30: qty 2

## 2023-09-30 MED ORDER — PROPOFOL 10 MG/ML IV BOLUS
INTRAVENOUS | Status: DC | PRN
  Administered 2023-09-30: 150 mg via INTRAVENOUS

## 2023-09-30 MED ORDER — BUPIVACAINE-EPINEPHRINE 0.5% -1:200000 IJ SOLN
INTRAMUSCULAR | Status: DC | PRN
  Administered 2023-09-30: 13 mL
  Administered 2023-09-30: 17 mL

## 2023-09-30 MED ORDER — PHENYLEPHRINE 80 MCG/ML (10ML) SYRINGE FOR IV PUSH (FOR BLOOD PRESSURE SUPPORT)
PREFILLED_SYRINGE | INTRAVENOUS | Status: DC | PRN
  Administered 2023-09-30 (×2): 80 ug via INTRAVENOUS
  Administered 2023-09-30 (×3): 160 ug via INTRAVENOUS
  Administered 2023-09-30: 80 ug via INTRAVENOUS

## 2023-09-30 MED ORDER — BUPIVACAINE LIPOSOME 1.3 % IJ SUSP
INTRAMUSCULAR | Status: AC
  Filled 2023-09-30: qty 20

## 2023-09-30 MED ORDER — CHLORHEXIDINE GLUCONATE 0.12 % MT SOLN
15.0000 mL | Freq: Once | OROMUCOSAL | Status: AC
  Administered 2023-09-30: 15 mL via OROMUCOSAL

## 2023-09-30 MED ORDER — HYDROMORPHONE HCL 1 MG/ML IJ SOLN
INTRAMUSCULAR | Status: DC | PRN
  Administered 2023-09-30: .5 mg via INTRAVENOUS

## 2023-09-30 MED ORDER — DOCUSATE SODIUM 100 MG PO CAPS
100.0000 mg | ORAL_CAPSULE | Freq: Two times a day (BID) | ORAL | Status: AC
Start: 2023-09-30 — End: ?

## 2023-09-30 MED ORDER — EZETIMIBE 10 MG PO TABS
10.0000 mg | ORAL_TABLET | Freq: Every day | ORAL | Status: DC
  Administered 2023-09-30 – 2023-10-01 (×2): 10 mg via ORAL
  Filled 2023-09-30 (×4): qty 1

## 2023-09-30 MED ORDER — ROCURONIUM BROMIDE 10 MG/ML (PF) SYRINGE
PREFILLED_SYRINGE | INTRAVENOUS | Status: AC
  Filled 2023-09-30: qty 10

## 2023-09-30 SURGICAL SUPPLY — 70 items
APPLICATOR ARISTA FLEXITIP XL (MISCELLANEOUS) IMPLANT
APPLICATOR SURGIFLO ENDO (HEMOSTASIS) IMPLANT
BAG COUNTER SPONGE SURGICOUNT (BAG) IMPLANT
CHLORAPREP W/TINT 26 (MISCELLANEOUS) ×1 IMPLANT
CLIP LIGATING HEM O LOK PURPLE (MISCELLANEOUS) ×1 IMPLANT
CLIP LIGATING HEMO LOK XL GOLD (MISCELLANEOUS) IMPLANT
CLIP LIGATING HEMO O LOK GREEN (MISCELLANEOUS) ×2 IMPLANT
COVER SURGICAL LIGHT HANDLE (MISCELLANEOUS) ×1 IMPLANT
COVER TIP SHEARS 8 DVNC (MISCELLANEOUS) ×1 IMPLANT
CUTTER ECHEON FLEX ENDO 45 340 (ENDOMECHANICALS) IMPLANT
DERMABOND ADVANCED .7 DNX12 (GAUZE/BANDAGES/DRESSINGS) ×1 IMPLANT
DRAIN CHANNEL 15F RND FF 3/16 (WOUND CARE) IMPLANT
DRAPE ARM DVNC X/XI (DISPOSABLE) ×4 IMPLANT
DRAPE COLUMN DVNC XI (DISPOSABLE) ×1 IMPLANT
DRAPE INCISE IOBAN 66X45 STRL (DRAPES) ×1 IMPLANT
DRAPE SHEET LG 3/4 BI-LAMINATE (DRAPES) ×1 IMPLANT
DRIVER NDL LRG 8 DVNC XI (INSTRUMENTS) ×2 IMPLANT
DRIVER NDLE LRG 8 DVNC XI (INSTRUMENTS) ×2 IMPLANT
ELECT PENCIL ROCKER SW 15FT (MISCELLANEOUS) ×1 IMPLANT
ELECT REM PT RETURN 15FT ADLT (MISCELLANEOUS) ×1 IMPLANT
EVACUATOR SILICONE 100CC (DRAIN) IMPLANT
FORCEPS BPLR LNG DVNC XI (INSTRUMENTS) ×1 IMPLANT
FORCEPS PROGRASP DVNC XI (FORCEP) ×1 IMPLANT
GLOVE BIO SURGEON STRL SZ 6.5 (GLOVE) ×1 IMPLANT
GLOVE SURG LX STRL 7.5 STRW (GLOVE) ×2 IMPLANT
GOWN STRL REUS W/ TWL XL LVL3 (GOWN DISPOSABLE) ×2 IMPLANT
GOWN STRL SURGICAL XL XLNG (GOWN DISPOSABLE) ×1 IMPLANT
GUIDEWIRE STR DUAL SENSOR (WIRE) IMPLANT
HEMOSTAT ARISTA ABSORB 3G PWDR (HEMOSTASIS) IMPLANT
HOLDER FOLEY CATH W/STRAP (MISCELLANEOUS) IMPLANT
IRRIG SUCT STRYKERFLOW 2 WTIP (MISCELLANEOUS) ×1 IMPLANT
IRRIGATION SUCT STRKRFLW 2 WTP (MISCELLANEOUS) ×1 IMPLANT
KIT BASIN OR (CUSTOM PROCEDURE TRAY) ×1 IMPLANT
KIT TURNOVER KIT A (KITS) IMPLANT
LOOP VESSEL MAXI BLUE (MISCELLANEOUS) ×1 IMPLANT
MARKER SKIN DUAL TIP RULER LAB (MISCELLANEOUS) ×1 IMPLANT
NDL INSUFFLATION 14GA 120MM (NEEDLE) IMPLANT
NEEDLE INSUFFLATION 14GA 120MM (NEEDLE) IMPLANT
NS IRRIG 1000ML POUR BTL (IV SOLUTION) ×1 IMPLANT
PACK CYSTO (CUSTOM PROCEDURE TRAY) IMPLANT
PAD POSITIONING PINK XL (MISCELLANEOUS) ×1 IMPLANT
PROTECTOR NERVE ULNAR (MISCELLANEOUS) ×2 IMPLANT
RELOAD STAPLE 45 2.6 WHT THIN (STAPLE) IMPLANT
SCISSORS LAP 5X45 EPIX DISP (ENDOMECHANICALS) IMPLANT
SCISSORS MNPLR CVD DVNC XI (INSTRUMENTS) ×1 IMPLANT
SEAL UNIV 5-12 XI (MISCELLANEOUS) ×4 IMPLANT
SET TUBE SMOKE EVAC HIGH FLOW (TUBING) ×1 IMPLANT
SOL ELECTROSURG ANTI STICK (MISCELLANEOUS) ×1 IMPLANT
SOLUTION ELECTROSURG ANTI STCK (MISCELLANEOUS) ×1 IMPLANT
SPIKE FLUID TRANSFER (MISCELLANEOUS) ×1 IMPLANT
STAPLE RELOAD 45 WHT (STAPLE) IMPLANT
STENT URET 6FRX26 CONTOUR (STENTS) IMPLANT
SURGIFLO W/THROMBIN 8M KIT (HEMOSTASIS) IMPLANT
SUT ETHILON 3 0 PS 1 (SUTURE) IMPLANT
SUT MNCRL AB 4-0 PS2 18 (SUTURE) ×2 IMPLANT
SUT V-LOC BARB 180 2/0GR6 GS22 (SUTURE) ×1 IMPLANT
SUT VIC AB 0 CT1 27XBRD ANTBC (SUTURE) ×1 IMPLANT
SUT VIC AB 4-0 RB1 27XBRD (SUTURE) IMPLANT
SUT VICRYL 0 UR6 27IN ABS (SUTURE) IMPLANT
SUT VLOC BARB 180 ABS3/0GR12 (SUTURE) ×2 IMPLANT
SUTURE V-LC BRB 180 2/0GR6GS22 (SUTURE) ×1 IMPLANT
SUTURE VLOC BRB 180 ABS3/0GR12 (SUTURE) ×1 IMPLANT
SYS BAG RETRIEVAL 10MM (BASKET) ×2 IMPLANT
SYSTEM BAG RETRIEVAL 10MM (BASKET) ×1 IMPLANT
TOWEL OR 17X26 10 PK STRL BLUE (TOWEL DISPOSABLE) ×1 IMPLANT
TRAY FOLEY MTR SLVR 16FR STAT (SET/KITS/TRAYS/PACK) ×1 IMPLANT
TRAY LAPAROSCOPIC (CUSTOM PROCEDURE TRAY) ×1 IMPLANT
TROCAR Z THREAD OPTICAL 12X100 (TROCAR) ×1 IMPLANT
TROCAR Z-THREAD OPTICAL 5X100M (TROCAR) IMPLANT
WATER STERILE IRR 1000ML POUR (IV SOLUTION) ×1 IMPLANT

## 2023-09-30 NOTE — Discharge Instructions (Signed)

## 2023-09-30 NOTE — Anesthesia Preprocedure Evaluation (Signed)
 Anesthesia Evaluation  Patient identified by MRN, date of birth, ID band Patient awake    Reviewed: Allergy & Precautions, H&P , NPO status , Patient's Chart, lab work & pertinent test results  Airway Mallampati: II  TM Distance: >3 FB Neck ROM: Full    Dental no notable dental hx.    Pulmonary neg pulmonary ROS   Pulmonary exam normal breath sounds clear to auscultation       Cardiovascular hypertension, Pt. on medications Normal cardiovascular exam Rhythm:Regular Rate:Normal     Neuro/Psych CVA, No Residual Symptoms  negative psych ROS   GI/Hepatic negative GI ROS, Neg liver ROS,,,  Endo/Other  negative endocrine ROS    Renal/GU negative Renal ROS  negative genitourinary   Musculoskeletal  (+) Arthritis , Osteoarthritis,    Abdominal   Peds negative pediatric ROS (+)  Hematology negative hematology ROS (+)   Anesthesia Other Findings   Reproductive/Obstetrics negative OB ROS                             Anesthesia Physical Anesthesia Plan  ASA: 3  Anesthesia Plan: General   Post-op Pain Management: Dilaudid IV   Induction: Intravenous  PONV Risk Score and Plan: 2 and Ondansetron, Midazolam and Treatment may vary due to age or medical condition  Airway Management Planned: Oral ETT  Additional Equipment:   Intra-op Plan:   Post-operative Plan: Extubation in OR  Informed Consent: I have reviewed the patients History and Physical, chart, labs and discussed the procedure including the risks, benefits and alternatives for the proposed anesthesia with the patient or authorized representative who has indicated his/her understanding and acceptance.     Dental advisory given  Plan Discussed with: CRNA  Anesthesia Plan Comments:        Anesthesia Quick Evaluation

## 2023-09-30 NOTE — Anesthesia Postprocedure Evaluation (Signed)
 Anesthesia Post Note  Patient: Adam Berry  Procedure(s) Performed: XI RIGHT ROBOTIC ASSITED PARTIAL NEPHRECTOMY, CYSTOSCOPY AND STENT PLACEMENT (Right: Abdomen)     Patient location during evaluation: PACU Anesthesia Type: General Level of consciousness: awake and alert Pain management: pain level controlled Vital Signs Assessment: post-procedure vital signs reviewed and stable Respiratory status: spontaneous breathing, nonlabored ventilation and respiratory function stable Cardiovascular status: blood pressure returned to baseline and stable Postop Assessment: no apparent nausea or vomiting Anesthetic complications: no   No notable events documented.  Last Vitals:  Vitals:   09/30/23 1200 09/30/23 1215  BP: 125/81 121/80  Pulse: (!) 53 60  Resp: 16 15  Temp:    SpO2: 93% 92%    Last Pain:  Vitals:   09/30/23 1215  TempSrc:   PainSc: 0-No pain                 Lowella Curb

## 2023-09-30 NOTE — TOC Initial Note (Signed)
 Transition of Care [facility]) - Initial/Assessment Note    Patient Details  Name: Adam Berry MRN: [id number] Date of Birth: Nov 15, 1955  Transition of Care [facility]) CM/SW Contact:    Lanier Clam, RN Phone Number: 09/30/2023, 2:36 PM  Clinical Narrative:   d/c plan home.                Expected Discharge Plan: Home/Self Care Barriers to Discharge: Continued Medical Work up   Patient Goals and CMS Choice Patient states their goals for this hospitalization and ongoing recovery are:: Home          Expected Discharge Plan and Services                                              Prior Living Arrangements/Services                       Activities of Daily Living      Permission Sought/Granted                  Emotional Assessment              Admission diagnosis:  Renal mass [N28.89] Patient Active Problem List   Diagnosis Date Noted   Renal mass 09/30/2023   Diplopia 10/23/2022   Prediabetes 10/23/2022   Hypophosphatemia 10/23/2022   Hypomagnesemia 10/23/2022   Hypokalemia 10/23/2022   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 [place] [address] Phone: [phone] Fax: [phone]  [facility] [address] [place] [address] Phone: [phone] Fax: [phone]     Social Drivers of Health (SDOH) Social History: SDOH Screenings   Depression (PHQ2-9): Low Risk  (05/12/2022)  Tobacco Use: Medium Risk (09/30/2023)   SDOH Interventions:     Readmission Risk  Interventions     No data to display

## 2023-09-30 NOTE — Op Note (Addendum)
 Preoperative diagnosis:  right renal mass   Postoperative diagnosis:  same   Procedure: Robotic assisted laparoscopic right partial nephrectomy Intraoperative ultrasound of retroperitoneal organ Cystoscopy, right retrograde pyelogram with interpretation Right ureteral stent placement  Surgeon: Crist Fat, MD 1st assistant: Lujean Rave, PA-C  An assistant was required for this surgical procedure.  The duties of the assistant included but were not limited to suctioning, passing suture, camera manipulation, retraction. This procedure would not be able to be performed without an Geophysicist/field seismologist.   Anesthesia: General  Complications:  Small retraction injury of the mid ureter.  I closed it with 4-0 Vicryl, wrapped in a small omental wrap, and placed a 26 cm x 6 French double-J ureteral stent in the right ureter.  Intraoperative findings:  #1. The patient's right retrograde pyelogram was performed at the end of the case and demonstrated some extravasation of contrast but continuity of the ureter with no filling defects or other abnormalities.  There is no evidence of a urine leak at the site of the resection. #2. Warm ischemia time 12 minutes, tumor was located in the anterior upper pole and the margins were grossly negative. 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  He had trabeculated bladder.  His ureteral orifice was somewhat difficult to get to because of the obstructing prostate.    EBL: 150cc  Specimens: right renal mass   Indication:  Adam Berry is a 68 y.o. patient with right renal mass.  After reviewing the management options for treatment, he elected to proceed with the above surgical procedure(s). We have discussed the potential benefits and risks of the procedure, side effects of the proposed treatment, the likelihood of  the patient achieving the goals of the procedure, and any potential problems that might occur during the procedure or recuperation. Informed consent has been obtained.   Description of procedure:  An assistant was required for this surgical procedure.  The duties of the assistant included but were not limited to suctioning, passing suture, camera manipulation, retraction. This procedure would not be able to be performed without an Geophysicist/field seismologist.  The patient was taken to the operating room and a general anesthetic was administered. The patient was given preoperative antibiotics, placed in the right modified flank position with care to pad all potential pressure points, and prepped and draped in the usual sterile fashion. Next a preoperative timeout was performed.  A site was selected on the right side of the umbilicus for placement of the camera port. This was placed using a standard modified Hassan technique with entry into the peritoneum with a 10 mm 0 deg laparoscope with a visual obturator. We entered the peritoneum without incident and established pneumoperitoneum.  The camera was then used to inspect the abdomen and there was no evidence of any intra-abdominal injuries or other abnormalities. The remaining abdominal ports were then placed. 8 mm robotic ports were placed in the right upper quadrant, right lower quadrant, and far right lateral abdominal wall. A 12 mm port was placed in the upper midline for laparoscopic assistance. Lysis of adhesions were performed at the midline to allow placement of the 12 mm assistant port. All ports were placed under direct vision without difficulty. The surgical cart was then docked.   Utilizing the cautery scissors, the white line of Toldt was incised allowing the colon to be mobilized medially  and the plane between the mesocolon and the anterior layer of Gerota's fascia to be developed and the kidney to be exposed.  The ureter and gonadal vein were identified  inferiorly and the ureter was lifted anteriorly off the psoas muscle.  Dissection proceeded superiorly along the gonadal vein until the renal vein was identified.  The renal hilum was then carefully isolated with a combination of blunt and sharp dissection allowing the renal arterial and venous structures to be separated and isolated in preparation for renal hilar vessel clamping.  At this point I went back to reinspect the ureter and noted that there was a small ureterotomy.  This was located in the mid ureter.  I subsequently closed this with 2 interrupted 4-0 Vicryl.  I then took a small tongue of omentum and wrapped this area and then sutured it across using the same Vicryl stitch.  Attention turned to the kidney and the perinephric fat surrounding the renal mass was removed and the kidney was mobilized sufficiently for exposure and resection of the renal mass.   The margins were then demarcated using intraoperative ultrasound.  Once the renal mass was properly isolated, preparations were made for resection of the tumor.    The renal artery was then clamped with a bulldog clamp.  The tumor was then excised with cold scissor dissection along with an adequate visible gross margin of normal renal parenchyma. The tumor appeared to be excised without any gross violation of the tumor. The renal collecting system was not entered during removal of the tumor.  A running 3-0 V-lock suture was then brought through the capsule of the kidney and run along the base of the renal defect to provide hemostasis and close any entry into the renal collecting system if present. Weck clips were used to secure this suture outside the renal capsule at the proximal and distal ends. The bulldog clamps were then removed from the renal hilar vessel. A running 2-0 V lock suture was then used to close the capsule of the kidney using a sliding clip technique which resulted in excellent hemostasis. An additional hemostatic agent  (Surgiflo) was then placed into the renal defect. Surgicel was then placed over the defect.    Total warm renal ischemia time was  12 minutes. The renal tumor resection site was examined. Hemostasis appeared adequate.   The kidney was placed back into its normal anatomic position and covered with perinephric fat as needed.  A # 15 Blake drain was then brought through the lateral lower port site and positioned in the perinephric space.  It was secured to the skin with a nylon suture. The surgical robotic cart was undocked.  The renal tumor specimen was removed intact within an endopouch retrieval bag via the camera port sites.  The camera port site and the other 12 mm port site were then closed at the fascial level with 0-vicryl suture.  All other laparoscopic/robotic ports were removed under direct vision and the pneumoperitoneum let down with inspection of the operative field performed and hemostasis again confirmed. All incision sites were then injected with local anesthetic and reapproximated at the skin level with 4-0 monocryl subcuticular closures. Dermabond was applied to the skin.    The patient was then taken out of the lateral decubitus position and put onto a fluoroscopy compatible bed.  He is placed in the dorsolithotomy position and reprepped and draped in routine sterile fashion.  Second timeout was then performed for cystoscopy, right retrograde pyelogram, right stent  placement.  Cystoscopy was then performed under visual guidance.  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  He had trabeculated bladder.  His ureteral orifice was somewhat difficult to get to because of the obstructing prostate.  I cannulated the right UO with a 5 inch open-ended ureteral catheter and performed a retrograde pyelogram with the above findings.  I then advanced a wire through the open-ended catheter remove the catheter over the wire.  I then advanced a 6 French x 26  cm stent over the wire and into the right upper pole.  No string tether was left.  The patient Foley catheter was then replaced.   The patient tolerated the procedure well and without complications.  The patient was able to be extubated and transferred to the recovery unit in satisfactory condition.  Crist Fat, M.D.

## 2023-09-30 NOTE — Transfer of Care (Signed)
 Immediate Anesthesia Transfer of Care Note  Patient: Adam Berry  Procedure(s) Performed: Procedure(s) with comments: XI RIGHT ROBOTIC ASSITED PARTIAL NEPHRECTOMY, CYSTOSCOPY AND STENT PLACEMENT (Right) - 210 MINUTES NEEDED  Patient Location: PACU  Anesthesia Type:General  Level of Consciousness: Patient easily awoken, comfortable, cooperative, following commands, responds to stimulation.   Airway & Oxygen Therapy: Patient spontaneously breathing, ventilating well, oxygen via simple oxygen mask.  Post-op Assessment: Report given to PACU RN, vital signs reviewed and stable, moving all extremities.   Post vital signs: Reviewed and stable.  Complications: No apparent anesthesia complications  Last Vitals:  Vitals Value Taken Time  BP 147/94 09/30/23 1054  Temp    Pulse 61 09/30/23 1054  Resp 17 09/30/23 1057  SpO2 98 09/30/23 1054  Vitals shown include unfiled device data.  Last Pain:  Vitals:   09/30/23 0609  TempSrc:   PainSc: 0-No pain      Patients Stated Pain Goal: 5 (09/30/23 7829)  Complications: No notable events documented.

## 2023-09-30 NOTE — Anesthesia Procedure Notes (Signed)
 Procedure Name: Intubation Date/Time: 09/30/2023 7:49 AM  Performed by: Dennison Nancy, CRNAPre-anesthesia Checklist: Patient identified, Emergency Drugs available, Suction available, Patient being monitored and Timeout performed Patient Re-evaluated:Patient Re-evaluated prior to induction Oxygen Delivery Method: Circle system utilized Preoxygenation: Pre-oxygenation with 100% oxygen Induction Type: IV induction Ventilation: Mask ventilation without difficulty Laryngoscope Size: Mac and 4 Grade View: Grade I Tube type: Oral Tube size: 8.0 mm Number of attempts: 1 Airway Equipment and Method: Stylet Placement Confirmation: ETT inserted through vocal cords under direct vision, positive ETCO2, CO2 detector and breath sounds checked- equal and bilateral Secured at: 24 cm Tube secured with: Tape (secured with pink Hy-tape) Dental Injury: Teeth and Oropharynx as per pre-operative assessment

## 2023-09-30 NOTE — Interval H&P Note (Signed)
 History and Physical Interval Note:  09/30/2023 7:31 AM  Adam Berry  has presented today for surgery, with the diagnosis of RIGHT RENAL MASS.  The various methods of treatment have been discussed with the patient and family. After consideration of risks, benefits and other options for treatment, the patient has consented to  Procedure(s) with comments: XI RIGHT ROBOTIC ASSITED PARTIAL NEPHRECTOMY (Right) - 210 MINUTES NEEDED as a surgical intervention.  The patient's history has been reviewed, patient examined, no change in status, stable for surgery.  I have reviewed the patient's chart and labs.  Questions were answered to the patient's satisfaction.     Crist Fat

## 2023-09-30 NOTE — H&P (Signed)
 Pt presents today for pre-operative history and physical exam in anticipation of robotic assisted lap right partial nephrectomy by Dr. Marlou Porch on 09/30/23. He is doing well and is without complaint.   Pt denies F/C, HA, CP, SOB, N/V, diarrhea/constipation, back pain, flank pain, hematuria, and dysuria.     HX:   68 year old male who is seen and followed here for an elevated PSA. He is referred today for a renal mass.   The patient had a cardiac calcium score CT scan done. This demonstrated a small lung nodule. As part of the evaluation for the lung nodule he had a PET CT scan. This demonstrated a renal mass in the right kidney that was poorly characterized. He subsequently had an ultrasound that identified the mass but did not further characterize it. He is here today for further evaluation.   The patient's lung nodule was not PET avid, it was thought to be benign. I do not think that this is being worked up any further at this time, rather they are repeating the imaging in 6 months.    The patient is otherwise healthy with no real significant past medical or surgical history. He has no family history of cancer.   PSA History:  4.25.2019: TRUS/Bx. Prostatic volume was 53 mL. PSA 5.13. PSAD 0.10. All 12 cores benign.  6.8.2020: PSA 3.96  9.13.2021: PSA 3.8  10.10.2022: PSA 4.32 (14% free)  11.13.2023: 3.88.   Interval: Today the patient is here for 63-month interval follow-up. He had a CT scan performed with and without contrast of the abdomen and pelvis for evaluation of a poorly characterized right renal mass. This was performed at [facility] imaging, and as a result I do not have the images. I did look at the report which demonstrated an enhancing 3-1/2 cm right upper pole renal mass without invasion into 2 the surrounding structures or the renal vein. The patient is otherwise been doing well with no significant change or progression of his voiding symptoms. Denies any hematuria or flank pain.      ALLERGIES: seasonal allergies    MEDICATIONS: Aspirin  Cialis 20 mg tablet 1 tablet PO prn  Blackseed  Rosuvastatin Calcium  Vitamin C  Vitamin D2  Zinc     GU PSH: Prostate Needle Biopsy - 2019     NON-GU PSH: Surgical Pathology, Gross And Microscopic Examination For Prostate Needle - 2019 Visit Complexity (formerly [facility]) - 08/07/2023, 04/30/2023     GU PMH: Benign tumor right kidney - 08/07/2023, - 04/30/2023 Elevated PSA - 04/30/2023, Lower PSA trend, normal DRE, - 05/19/2022, Negative biopsy with history of stable PSA. Normal DRE today., - 04/24/2021, PSA lower/stable--DRE nml, - 2021, Negative biopsy a year ago, - 2020, - 2019, - 2019, - 2019 ED due to arterial insufficiency - 05/19/2022, He does well with tadalafil, - 04/24/2021, HE does well w/ Cialis, - 2021, - 2019, - 2019    NON-GU PMH: Hypercholesterolemia    FAMILY HISTORY: 2 daughters - Daughter 2 sons - Son Death - Father, Mother Heart Attack - Father   SOCIAL HISTORY: Marital Status: Divorced Preferred Language: English; Ethnicity: Not Hispanic Or Latino; Race: Black or African American Current Smoking Status: Patient has never smoked.   Tobacco Use Assessment Completed: Used Tobacco in last 30 days? Does drink.  Does not use drugs. Drinks 1 caffeinated drink per day. Has not had a blood transfusion.     Notes: ETOH rare    REVIEW OF SYSTEMS:    GU Review  Male:   Patient denies frequent urination, hard to postpone urination, burning/ pain with urination, get up at night to urinate, leakage of urine, stream starts and stops, trouble starting your stream, have to strain to urinate , erection problems, and penile pain.  Gastrointestinal (Upper):   Patient denies nausea, vomiting, and indigestion/ heartburn.  Gastrointestinal (Lower):   Patient denies diarrhea and constipation.  Constitutional:   Patient denies fever, night sweats, weight loss, and fatigue.  Skin:   Patient denies skin rash/ lesion  and itching.  Eyes:   Patient denies blurred vision and double vision.  Ears/ Nose/ Throat:   Patient denies sore throat and sinus problems.  Hematologic/Lymphatic:   Patient denies swollen glands and easy bruising.  Cardiovascular:   Patient denies leg swelling and chest pains.  Respiratory:   Patient denies cough and shortness of breath.  Endocrine:   Patient denies excessive thirst.  Musculoskeletal:   Patient denies back pain and joint pain.  Neurological:   Patient denies headaches and dizziness.  Psychologic:   Patient denies depression and anxiety.   VITAL SIGNS:      09/22/2023 02:14 PM  Weight 210 lb / 95.25 kg  Height 73 in / 185.42 cm  BP 128/80 mmHg  Heart Rate 75 /min  Temperature 98.0 F / 36.6 C  BMI 27.7 kg/m   MULTI-SYSTEM PHYSICAL EXAMINATION:    Constitutional: Well-nourished. No physical deformities. Normally developed. Good grooming.  Neck: Neck symmetrical, not swollen. Normal tracheal position.  Respiratory: Normal breath sounds. No labored breathing, no use of accessory muscles.   Cardiovascular: Regular rate and rhythm. No murmur, no gallop.   Lymphatic: No enlargement of neck, axillae, groin.  Skin: No paleness, no jaundice, no cyanosis. No lesion, no ulcer, no rash.  Neurologic / Psychiatric: Oriented to time, oriented to place, oriented to person. No depression, no anxiety, no agitation.  Gastrointestinal: No mass, no tenderness, no rigidity, non obese abdomen.  Eyes: Normal conjunctivae. Normal eyelids.  Ears, Nose, Mouth, and Throat: Left ear no scars, no lesions, no masses. Right ear no scars, no lesions, no masses. Nose no scars, no lesions, no masses. Normal hearing. Normal lips.  Musculoskeletal: Normal gait and station of head and neck.     Complexity of Data:  Records Review:   Previous Patient Records  Urine Test Review:   Urinalysis   09/22/23  Urinalysis  Urine Appearance Cloudy   Urine Color Amber   Urine Glucose Neg mg/dL  Urine  Bilirubin Neg mg/dL  Urine Ketones Neg mg/dL  Urine Specific Gravity 1.025   Urine Blood Trace ery/uL  Urine pH 6.5   Urine Protein Trace mg/dL  Urine Urobilinogen 1.0 mg/dL  Urine Nitrites Neg   Urine Leukocyte Esterase 1+ leu/uL  Urine WBC/hpf 20 - 40/hpf   Urine RBC/hpf 3 - 10/hpf   Urine Epithelial Cells 0 - 5/hpf   Urine Bacteria Many (>50/hpf)   Urine Mucous Present   Urine Yeast NS (Not Seen)   Urine Trichomonas Not Present   Urine Cystals NS (Not Seen)   Urine Casts NS (Not Seen)   Urine Sperm Not Present    PROCEDURES:          Urinalysis w/Scope - 81001 Dipstick Dipstick Cont'd Micro  Color: Amber Bilirubin: Neg mg/dL WBC/hpf: 20 - 16/XWR  Appearance: Cloudy Ketones: Neg mg/dL RBC/hpf: 3 - 60/AVW  Specific Gravity: 1.025 Blood: Trace ery/uL Bacteria: Many (>50/hpf)  pH: 6.5 Protein: Trace mg/dL Cystals: NS (Not Seen)  Glucose:  Neg mg/dL Urobilinogen: 1.0 mg/dL Casts: NS (Not Seen)    Nitrites: Neg Trichomonas: Not Present    Leukocyte Esterase: 1+ leu/uL Mucous: Present      Epithelial Cells: 0 - 5/hpf      Yeast: NS (Not Seen)      Sperm: Not Present    ASSESSMENT:      ICD-10 Details  1 GU:   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 Marlou Porch. Pt is scheduled to undergo RAL right partial nephrectomy on 09/30/23.   Urine for culture.   All pt's questions were answered to the best of my ability.

## 2023-10-01 ENCOUNTER — Encounter (HOSPITAL_COMMUNITY): Payer: Self-pay | Admitting: Urology

## 2023-10-01 DIAGNOSIS — N2889 Other specified disorders of kidney and ureter: Secondary | ICD-10-CM | POA: Diagnosis not present

## 2023-10-01 LAB — BASIC METABOLIC PANEL WITH GFR
Anion gap: 6 (ref 5–15)
BUN: 13 mg/dL (ref 8–23)
CO2: 26 mmol/L (ref 22–32)
Calcium: 8.7 mg/dL — ABNORMAL LOW (ref 8.9–10.3)
Chloride: 105 mmol/L (ref 98–111)
Creatinine, Ser: 1.16 mg/dL (ref 0.61–1.24)
GFR, Estimated: >60 mL/min (ref >60)
Glucose, Bld: 96 mg/dL (ref 70–99)
Potassium: 4 mmol/L (ref 3.5–5.1)
Sodium: 137 mmol/L (ref 135–145)

## 2023-10-01 LAB — CREATININE, FLUID (PLEURAL, PERITONEAL, JP DRAINAGE): Creat, Fluid: 1.1 mg/dL

## 2023-10-01 LAB — HEMOGLOBIN AND HEMATOCRIT, BLOOD
HCT: 36.5 % — ABNORMAL LOW (ref 39.0–52.0)
Hemoglobin: 11.4 g/dL — ABNORMAL LOW (ref 13.0–17.0)

## 2023-10-01 MED ORDER — TAMSULOSIN HCL 0.4 MG PO CAPS: 0.4000 mg | ORAL_CAPSULE | Freq: Every day | ORAL | 0 refills | Status: AC

## 2023-10-01 MED ORDER — ACETAMINOPHEN 10 MG/ML IV SOLN
1000.0000 mg | Freq: Four times a day (QID) | INTRAVENOUS | Status: DC
  Administered 2023-10-01 – 2023-10-02 (×3): 1000 mg via INTRAVENOUS
  Filled 2023-10-01 (×3): qty 100

## 2023-10-01 MED ORDER — TAMSULOSIN HCL 0.4 MG PO CAPS
0.4000 mg | ORAL_CAPSULE | Freq: Every day | ORAL | Status: DC
  Administered 2023-10-01 – 2023-10-02 (×2): 0.4 mg via ORAL
  Filled 2023-10-01 (×2): qty 1

## 2023-10-01 NOTE — Plan of Care (Signed)
  Problem: Education: Goal: Knowledge of General Education information will improve Description: Including pain rating scale, medication(s)/side effects and non-pharmacologic comfort measures Outcome: Progressing   Problem: Health Behavior/Discharge Planning: Goal: Ability to manage health-related needs will improve Outcome: Progressing   Problem: Clinical Measurements: Goal: Ability to maintain clinical measurements within normal limits will improve Outcome: Progressing Goal: Will remain free from infection Outcome: Progressing Goal: Diagnostic test results will improve Outcome: Progressing Goal: Respiratory complications will improve Outcome: Progressing Goal: Cardiovascular complication will be avoided Outcome: Progressing   Problem: Activity: Goal: Risk for activity intolerance will decrease Outcome: Progressing   Problem: Nutrition: Goal: Adequate nutrition will be maintained Outcome: Progressing   Problem: Coping: Goal: Level of anxiety will decrease Outcome: Progressing   Problem: Elimination: Goal: Will not experience complications related to bowel motility Outcome: Progressing Goal: Will not experience complications related to urinary retention Outcome: Progressing   Problem: Pain Managment: Goal: General experience of comfort will improve and/or be controlled Outcome: Progressing   Problem: Safety: Goal: Ability to remain free from injury will improve Outcome: Progressing   Problem: Skin Integrity: Goal: Risk for impaired skin integrity will decrease Outcome: Progressing   Problem: Education: Goal: Knowledge of the prescribed therapeutic regimen will improve Outcome: Progressing   Problem: Bowel/Gastric: Goal: Gastrointestinal status for postoperative course will improve Outcome: Progressing   Problem: Clinical Measurements: Goal: Postoperative complications will be avoided or minimized Outcome: Progressing   Problem: Respiratory: Goal:  Ability to achieve and maintain a regular respiratory rate will improve Outcome: Progressing   Problem: Skin Integrity: Goal: Demonstration of wound healing without infection will improve Outcome: Progressing   Problem: Urinary Elimination: Goal: Ability to avoid or minimize complications of infection will improve Outcome: Progressing Goal: Ability to achieve and maintain urine output will improve Outcome: Progressing

## 2023-10-01 NOTE — Progress Notes (Signed)
 Patient endorsing increased abdominal discomfort through shift, nausea/vomiting - mainly when voiding. PRN zofran and analgesics slightly helpful per patient. Foley removed, patient able to void but still has burning, discomfort and right flank pressure when he voids. JP drain discontinued. Patient ambulates slowly with FWW. Low appetite. Afebrile. Post void bladder scan showed 166 cc.

## 2023-10-01 NOTE — Progress Notes (Signed)
 Patients pain worsened this PM once his numbing medication wore off.  He required IV pain medication for better controll.  Plan to keep him one more night and discharge tomorrow.  Plan:  IV Tylenol and oral Tramadol for pain Tamsulosin for voiding symptoms.

## 2023-10-01 NOTE — TOC Transition Note (Signed)
 Transition of Care [facility]) - Discharge Note   Patient Details  Name: Adam Berry MRN: [id number] Date of Birth: 02/03/1956  [facility] CM/SW Contact:  Lanier Clam, RN Phone Number: 10/01/2023, 11:37 AM   Clinical Narrative: d/c home No CM needs.      Final next level of care: Home/Self Care Barriers to Discharge: No Barriers Identified   Patient Goals and CMS Choice Patient states their goals for this hospitalization and ongoing recovery are:: Home CMS Medicare.gov Compare Post Acute Care list provided to:: Patient Choice offered to / list presented to : Patient  ownership interest in [name].provided to:: Patient    Discharge Placement                       Discharge Plan and Services Additional resources added to the After Visit Summary for                                       Social Drivers of Health (SDOH) Interventions SDOH Screenings   Food Insecurity: No Food Insecurity (09/30/2023)  Housing: Low Risk  (09/30/2023)  Transportation Needs: No Transportation Needs (09/30/2023)  Utilities: Not At Risk (09/30/2023)  Depression (PHQ2-9): Low Risk  (05/12/2022)  Social Connections: Socially Integrated (09/30/2023)  Tobacco Use: Medium Risk (09/30/2023)     Readmission Risk Interventions     No data to display

## 2023-10-01 NOTE — Progress Notes (Signed)
 Urology Inpatient Progress Report  Renal mass [N28.89] Procedure(s): XI RIGHT ROBOTIC ASSITED PARTIAL NEPHRECTOMY, CYSTOSCOPY AND STENT PLACEMENT 1 Day Post-Op  Intv/Subj: No acute events overnight. Patient is without complaint.  Principal Problem:   Renal mass  Current Facility-Administered Medications  Medication Dose Route Frequency Provider Last Rate Last Admin   0.45 % sodium chloride infusion   Intravenous Continuous Harrie Foreman, PA-C   Stopped at 10/01/23 0615   acetaminophen (OFIRMEV) IV 1,000 mg  1,000 mg Intravenous Q6H Harrie Foreman, PA-C 400 mL/hr at 10/01/23 9629 Infusion Verify at 10/01/23 5284   diphenhydrAMINE (BENADRYL) injection 12.5 mg  12.5 mg Intravenous Q6H PRN Harrie Foreman, PA-C       Or   diphenhydrAMINE (BENADRYL) 12.5 MG/5ML elixir 12.5 mg  12.5 mg Oral Q6H PRN Harrie Foreman, PA-C       docusate sodium (COLACE) capsule 100 mg  100 mg Oral BID Harrie Foreman, PA-C   100 mg at 09/30/23 2211   ezetimibe (ZETIA) tablet 10 mg  10 mg Oral Daily Harrie Foreman, PA-C   10 mg at 09/30/23 1759   HYDROmorphone (DILAUDID) injection 0.5-1 mg  0.5-1 mg Intravenous Q2H PRN Harrie Foreman, PA-C   1 mg at 09/30/23 1445   hyoscyamine (LEVSIN SL) SL tablet 0.125 mg  0.125 mg Sublingual Q4H PRN Harrie Foreman, PA-C       neomycin-bacitracin-polymyxin [phone] OINT 1 Application  1 Application Topical TID PRN Harrie Foreman, PA-C       ondansetron (ZOFRAN) injection 4 mg  4 mg Intravenous Q4H PRN Dancy, Marchelle Folks, PA-C       rosuvastatin (CRESTOR) tablet 20 mg  20 mg Oral Daily Harrie Foreman, PA-C   20 mg at 09/30/23 1759   traMADol (ULTRAM) tablet 50-100 mg  50-100 mg Oral Q6H PRN Harrie Foreman, PA-C   50 mg at 10/01/23 0444     Objective: Vital: Vitals:   09/30/23 1835 09/30/23 2300 10/01/23 0400 10/01/23 0650  BP: 112/68 112/67 109/70 107/70  Pulse: 63 69 75 77  Resp: 20 18 17 19   Temp: 98.3 F (36.8 C) (!) 97.4 F (36.3 C) 98.1 F (36.7 C) 97.9 F (36.6 C)   TempSrc: Oral Oral Oral Oral  SpO2: 96% 98% 96% 97%  Weight:      Height:       I/Os: I/O last 3 completed shifts: In: 4731.1 [P.O.:240; I.V.:3991.1; IV Piggyback:500] Out: 1285 [Urine:1025; Drains:135; Blood:125]  Physical Exam:  General: Patient is in no apparent distress Lungs: Normal respiratory effort, chest expands symmetrically. GI: Incisions are c/d/i.  The abdomen is soft and nontender without mass. JP drain with serosanguinous drainage Foley: pink  Ext: lower extremities symmetric  Lab Results: Recent Labs    09/30/23 1110 10/01/23 0513  HGB 13.2 11.4*  HCT 42.8 36.5*   Recent Labs    10/01/23 0513  NA 137  K 4.0  CL 105  CO2 26  GLUCOSE 96  BUN 13  CREATININE 1.16  CALCIUM 8.7*   No results for input(s): "LABPT", "INR" in the last 72 hours. No results for input(s): "LABURIN" in the last 72 hours. Results for orders placed or performed in visit on 06/08/23  MICROSCOPIC MESSAGE     Status: None   Collection Time: 06/08/23  2:48 PM  Result Value Ref Range Status   Note   Final    Comment: This urine was analyzed for the presence of WBC,  RBC, bacteria, casts, and other formed elements.  Only those elements  seen were reported. Marland Kitchen     Studies/Results: DG C-Arm 1-60 Min-No Report Result Date: 09/30/2023 Fluoroscopy was utilized by the requesting physician.  No radiographic interpretation.    Assessment: Procedure(s): XI RIGHT ROBOTIC ASSITED PARTIAL NEPHRECTOMY, CYSTOSCOPY AND STENT PLACEMENT, 1 Day Post-Op  doing well.  Plan: D/c foley Send JP creatinine ADAT Activity ad lib Re-eval for discharge around lunch today.   Berniece Salines, MD Urology 10/01/2023, 7:34 AM

## 2023-10-01 NOTE — Discharge Summary (Signed)
 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 Briefly, Adam Berry is a 68 y.o. year old patient with large right renal mass.   Hospital Course: Patient tolerated the procedure well.  He was then transferred to the floor after an uneventful PACU stay.  His hospital course was uncomplicated.  On POD#1  he had met discharge criteria: was eating a regular diet, was up and ambulating independently,  pain was well controlled, was voiding without a catheter, and was ready to for discharge.   Laboratory values:  Recent Labs    09/30/23 1110 10/01/23 0513  HGB 13.2 11.4*  HCT 42.8 36.5*   Recent Labs    10/01/23 0513  NA 137  K 4.0  CL 105  CO2 26  GLUCOSE 96  BUN 13  CREATININE 1.16  CALCIUM 8.7*   No results for input(s): "LABPT", "INR" in the last 72 hours. No results for input(s): "LABURIN" in the last 72 hours. Results for orders placed or performed in visit on 06/08/23  MICROSCOPIC MESSAGE     Status: None   Collection Time: 06/08/23  2:48 PM  Result Value Ref Range Status   Note   Final    Comment: This urine was  analyzed for the presence of WBC,  RBC, bacteria, casts, and other formed elements.  Only those elements seen were reported. . .     Disposition: Home  Discharge instruction: The patient was instructed to be ambulatory but told to refrain from heavy lifting, strenuous activity, or driving.   Discharge medications:  Allergies as of 10/02/2023       Reactions   Ppd [tuberculin Purified Protein Derivative] Other (See Comments)   Hx/o strong reaction in childhood- "skin bubbled"        Medication List     STOP taking these medications    ascorbic acid 500 MG tablet Commonly known as: VITAMIN C   aspirin 81 MG tablet   BEET ROOT PO   CAYENNE PEPPER PO   UNABLE TO FIND   UNABLE TO FIND   Vitamin D3 125 MCG (5000 UT) Caps   Zinc 50 MG Tabs       TAKE these medications    BLACK CURRANT SEED OIL PO Take 15 mLs by mouth daily.   docusate sodium 100 MG capsule Commonly known as: COLACE Take 1 capsule (100 mg total) by mouth 2 (two) times daily.   ezetimibe 10 MG tablet Commonly known as: ZETIA Take 1 tablet (10 mg total) by mouth daily.   NON FORMULARY Take 3-4 Scoops by mouth See admin instructions. HumanN SuperBeets Beet Root Powder- Mix 3-4 scoopsful as directed and drink by mouth once a day   NON FORMULARY Take  1 capsule by mouth See admin instructions. [place] Moss- Take 1 capsule by mouth once a day   rosuvastatin 20 MG tablet Commonly known as: CRESTOR Take 1 tablet (20 mg total) by mouth daily.   tamsulosin 0.4 MG Caps capsule Commonly known as: FLOMAX Take 1 capsule (0.4 mg total) by mouth daily.   traMADol 50 MG tablet Commonly known as: Ultram Take 1-2 tablets (50-100 mg total) by mouth every 6 (six) hours as needed for moderate pain (pain score 4-6) or severe pain (pain score 7-10).        Followup:   Follow-up Information     Vanessa Barbara, NP Follow up on 10/14/2023.   Why: at 3:00 Contact information: [address] [place] [address] [phone]

## 2023-10-02 DIAGNOSIS — N2889 Other specified disorders of kidney and ureter: Secondary | ICD-10-CM | POA: Diagnosis not present

## 2023-10-02 LAB — SURGICAL PATHOLOGY

## 2023-10-02 NOTE — Progress Notes (Signed)
 Urology Inpatient Progress Report  Renal mass [N28.89] Procedure(s): XI RIGHT ROBOTIC ASSITED PARTIAL NEPHRECTOMY, CYSTOSCOPY AND STENT PLACEMENT 2 Days Post-Op  Intv/Subj: Stayed overnight due to pain in his abdomen when ambulating Voiding without issues - mild flank pain No events overnight Feels better this AM  Principal Problem:   Renal mass  Current Facility-Administered Medications  Medication Dose Route Frequency Provider Last Rate Last Admin   acetaminophen (OFIRMEV) IV 1,000 mg  1,000 mg Intravenous Q6H Crist Fat, MD 400 mL/hr at 10/02/23 0612 1,000 mg at 10/02/23 1610   diphenhydrAMINE (BENADRYL) injection 12.5 mg  12.5 mg Intravenous Q6H PRN Harrie Foreman, PA-C       Or   diphenhydrAMINE (BENADRYL) 12.5 MG/5ML elixir 12.5 mg  12.5 mg Oral Q6H PRN Dancy, Amanda, PA-C       docusate sodium (COLACE) capsule 100 mg  100 mg Oral BID Dancy, Amanda, PA-C   100 mg at 10/01/23 2125   ezetimibe (ZETIA) tablet 10 mg  10 mg Oral Daily Harrie Foreman, PA-C   10 mg at 10/01/23 2125   HYDROmorphone (DILAUDID) injection 0.5-1 mg  0.5-1 mg Intravenous Q2H PRN Harrie Foreman, PA-C   0.5 mg at 10/01/23 1635   hyoscyamine (LEVSIN SL) SL tablet 0.125 mg  0.125 mg Sublingual Q4H PRN Harrie Foreman, PA-C       neomycin-bacitracin-polymyxin [phone] OINT 1 Application  1 Application Topical TID PRN Harrie Foreman, PA-C       ondansetron (ZOFRAN) injection 4 mg  4 mg Intravenous Q4H PRN Harrie Foreman, PA-C   4 mg at 10/01/23 1506   rosuvastatin (CRESTOR) tablet 20 mg  20 mg Oral Daily Harrie Foreman, PA-C   20 mg at 10/01/23 2125   tamsulosin (FLOMAX) capsule 0.4 mg  0.4 mg Oral Daily Crist Fat, MD   0.4 mg at 10/01/23 1807   traMADol (ULTRAM) tablet 50-100 mg  50-100 mg Oral Q6H PRN Harrie Foreman, PA-C   50 mg at 10/01/23 1510     Objective: Vital: Vitals:   10/01/23 0400 10/01/23 0650 10/01/23 2148 10/02/23 0450  BP: 109/70 107/70 133/89 (!) 144/85  Pulse: 75 77 81  86  Resp: 17 19 16 18   Temp: 98.1 F (36.7 C) 97.9 F (36.6 C) 98.9 F (37.2 C) 98.9 F (37.2 C)  TempSrc: Oral Oral Oral Oral  SpO2: 96% 97% 94% 96%  Weight:      Height:       I/Os: I/O last 3 completed shifts: In: 1859.9 [P.O.:440; I.V.:919.9; IV Piggyback:500] Out: 1850 [Urine:1720; Drains:130]  Physical Exam:  General: Patient is in no apparent distress Lungs: Normal respiratory effort, chest expands symmetrically. GI: Incisions are c/d/i.  The abdomen is soft and nontender without mass. Foley out Ext: lower extremities symmetric  Lab Results: Recent Labs    09/30/23 1110 10/01/23 0513  HGB 13.2 11.4*  HCT 42.8 36.5*   Recent Labs    10/01/23 0513  NA 137  K 4.0  CL 105  CO2 26  GLUCOSE 96  BUN 13  CREATININE 1.16  CALCIUM 8.7*   No results for input(s): "LABPT", "INR" in the last 72 hours. No results for input(s): "LABURIN" in the last 72 hours. Results for orders placed or performed in visit on 06/08/23  MICROSCOPIC MESSAGE     Status: None   Collection Time: 06/08/23  2:48 PM  Result Value Ref Range Status   Note   Final    Comment: This urine was analyzed for  the presence of WBC,  RBC, bacteria, casts, and other formed elements.  Only those elements seen were reported. Marland Kitchen     Studies/Results: DG C-Arm 1-60 Min-No Report Result Date: 09/30/2023 Fluoroscopy was utilized by the requesting physician.  No radiographic interpretation.    Assessment: Procedure(s): XI RIGHT ROBOTIC ASSITED PARTIAL NEPHRECTOMY, CYSTOSCOPY AND STENT PLACEMENT, 2 Days Post-Op  doing well.  Plan: Pain better controlled today. Eating without emesis Walking better Voiding on his own Okay for discharge.   Berniece Salines, MD Urology 10/02/2023, 7:27 AM

## 2023-10-02 NOTE — Progress Notes (Signed)
Patient discharged: Home with family  Via: Wheelchair   Discharge paperwork given: to patient and family  Reviewed with teach back  IV removed  Belongings given to patient

## 2024-01-11 ENCOUNTER — Ambulatory Visit (HOSPITAL_COMMUNITY)
Admission: RE | Admit: 2024-01-11 | Discharge: 2024-01-11 | Disposition: A | Discharge status: Home / Self Care | Payer: BC Managed Care – PPO | Source: Ambulatory Visit | Attending: Cardiology | Admitting: Cardiology

## 2024-01-11 DIAGNOSIS — I517 Cardiomegaly: Secondary | ICD-10-CM

## 2024-01-11 DIAGNOSIS — I77819 Aortic ectasia, unspecified site: Secondary | ICD-10-CM | POA: Insufficient documentation

## 2024-01-11 DIAGNOSIS — R079 Chest pain, unspecified: Secondary | ICD-10-CM

## 2024-01-11 DIAGNOSIS — I77810 Thoracic aortic ectasia: Secondary | ICD-10-CM

## 2024-01-11 DIAGNOSIS — I088 Other rheumatic multiple valve diseases: Secondary | ICD-10-CM | POA: Diagnosis not present

## 2024-01-11 DIAGNOSIS — R008 Other abnormalities of heart beat: Secondary | ICD-10-CM

## 2024-01-11 DIAGNOSIS — I10 Essential (primary) hypertension: Secondary | ICD-10-CM | POA: Diagnosis not present

## 2024-01-11 LAB — ECHOCARDIOGRAM COMPLETE: S' Lateral: 3.1 cm

## 2024-01-12 ENCOUNTER — Ambulatory Visit: Payer: Self-pay | Admitting: Cardiology

## 2024-01-27 ENCOUNTER — Ambulatory Visit: Payer: Self-pay | Admitting: Emergency Medicine

## 2024-01-27 DIAGNOSIS — R911 Solitary pulmonary nodule: Secondary | ICD-10-CM

## 2024-02-01 NOTE — Progress Notes (Signed)
 Tried calling the patient, no answer- vm was full so unable to leave a message. Will send a Mychart message.  Order has been placed.

## 2024-06-07 ENCOUNTER — Encounter: Payer: BC Managed Care – PPO | Admitting: Nurse Practitioner

## 2024-09-07 ENCOUNTER — Other Ambulatory Visit
